# Patient Record
Sex: Female | Born: 1935 | Race: White | Hispanic: No | Smoking: Never smoker
Health system: Southern US, Community
[De-identification: ages and names within clinical notes are randomized; demographics above are authoritative.]

## PROBLEM LIST (undated history)

## (undated) DIAGNOSIS — C50919 Malignant neoplasm of unspecified site of unspecified female breast: Secondary | ICD-10-CM

## (undated) DIAGNOSIS — I10 Essential (primary) hypertension: Secondary | ICD-10-CM

## (undated) HISTORY — PX: BACK SURGERY: SHX140

## (undated) HISTORY — PX: MASTECTOMY: SHX3

## (undated) HISTORY — PX: ABDOMINAL HYSTERECTOMY: SHX81

## (undated) HISTORY — PX: BREAST SURGERY: SHX581

---

## 2003-04-16 ENCOUNTER — Other Ambulatory Visit: Payer: Self-pay

## 2003-11-30 ENCOUNTER — Other Ambulatory Visit: Payer: Self-pay

## 2004-05-30 ENCOUNTER — Ambulatory Visit: Payer: Self-pay | Admitting: Internal Medicine

## 2004-06-16 ENCOUNTER — Ambulatory Visit: Payer: Self-pay

## 2004-08-28 ENCOUNTER — Ambulatory Visit: Payer: Self-pay | Admitting: Internal Medicine

## 2005-03-01 ENCOUNTER — Ambulatory Visit: Payer: Self-pay | Admitting: Internal Medicine

## 2005-08-31 ENCOUNTER — Ambulatory Visit: Payer: Self-pay | Admitting: Internal Medicine

## 2006-04-15 ENCOUNTER — Inpatient Hospital Stay: Payer: Self-pay | Admitting: Internal Medicine

## 2006-07-09 ENCOUNTER — Ambulatory Visit: Payer: Self-pay | Admitting: Gastroenterology

## 2006-08-17 IMAGING — CT CT CHEST W/ CM
1 series · 15 of 33 positions shown, 19 images · non-contrast
Comparison: none

REASON FOR EXAM: ABNORMAL CHEST XRAY      DYSPNEA RT UPPER LOBE
COMMENTS:

[Series 3: soft tissue · axial · 0.74mm/px · z∈[-262,-12]mm · 15 of 60 slices shown, 19 images]
[im 5/60  mediastinal]
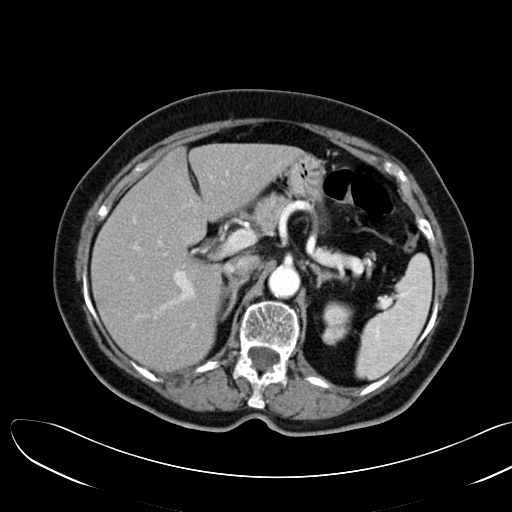
[im 5/60  lung]
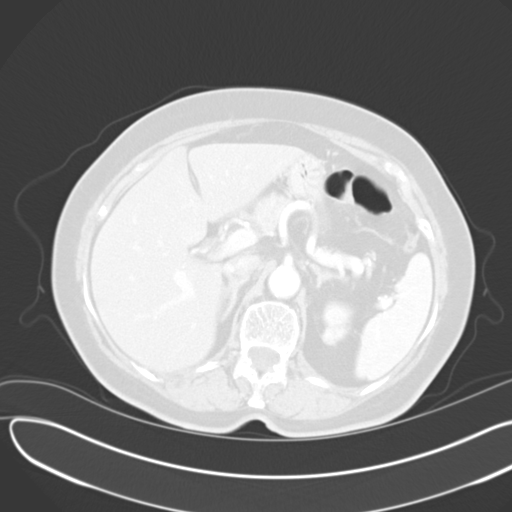
[im 9/60  lung]
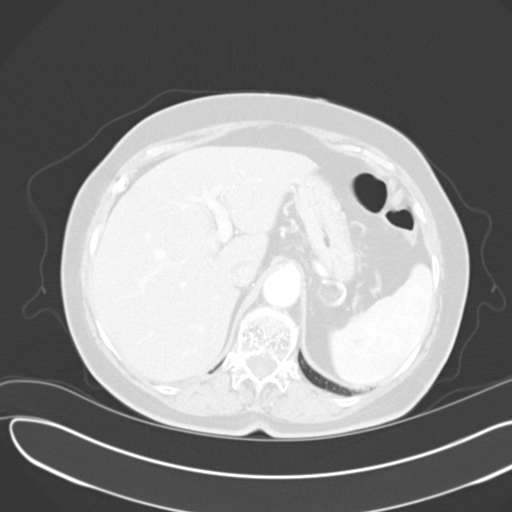
[im 12/60  lung]
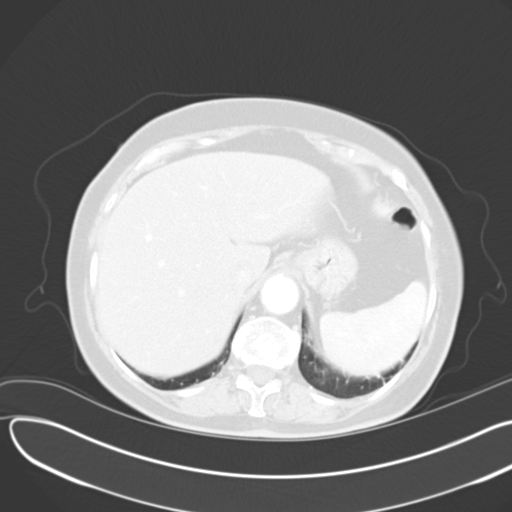
[im 16/60  lung]
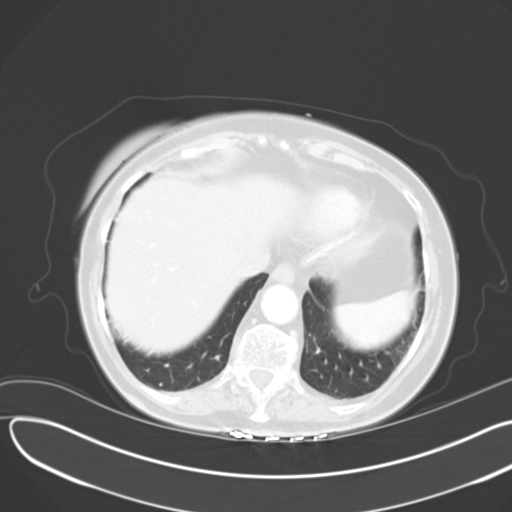
[im 20/60  mediastinal]
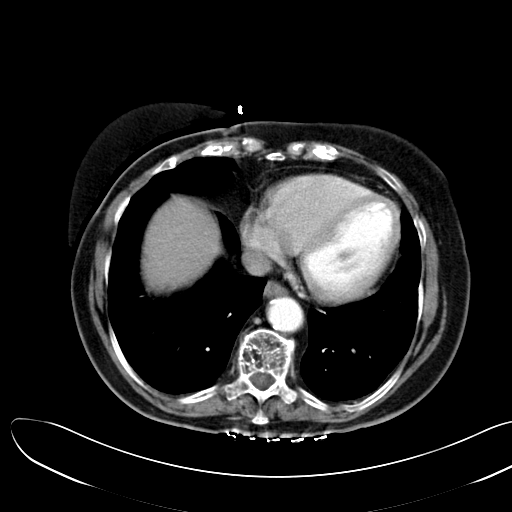
[im 20/60  lung]
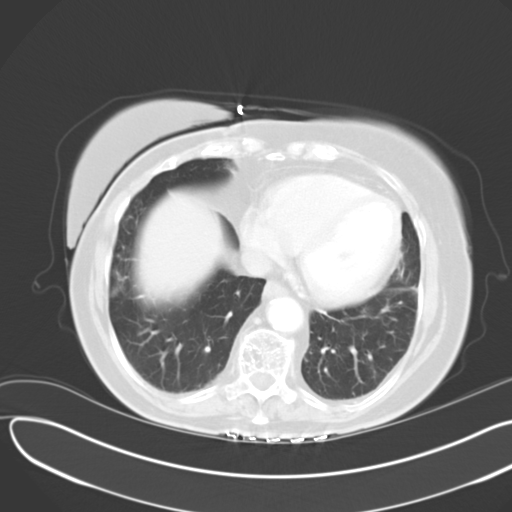
[im 24/60  lung]
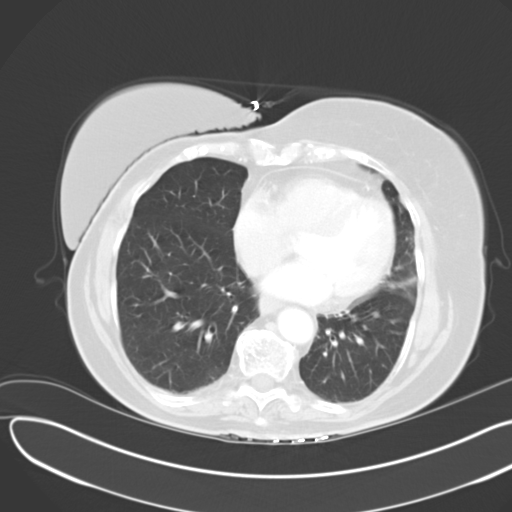
[im 27/60  lung]
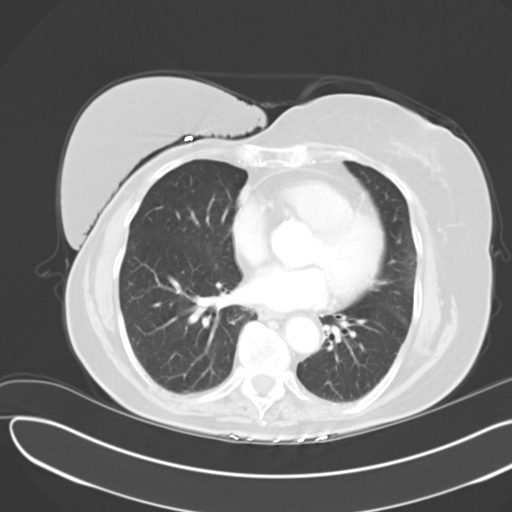
[im 31/60  lung]
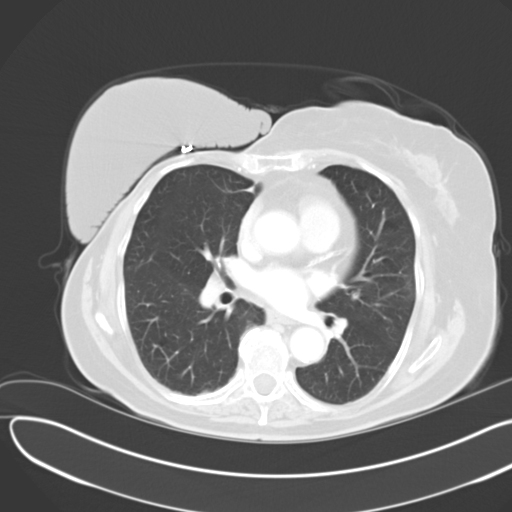
[im 33/60  mediastinal]
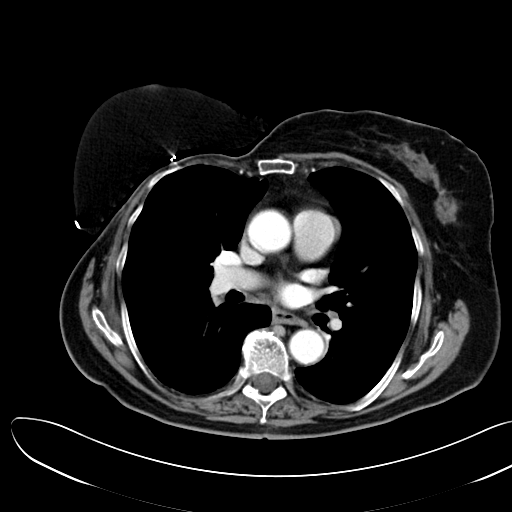
[im 33/60  lung]
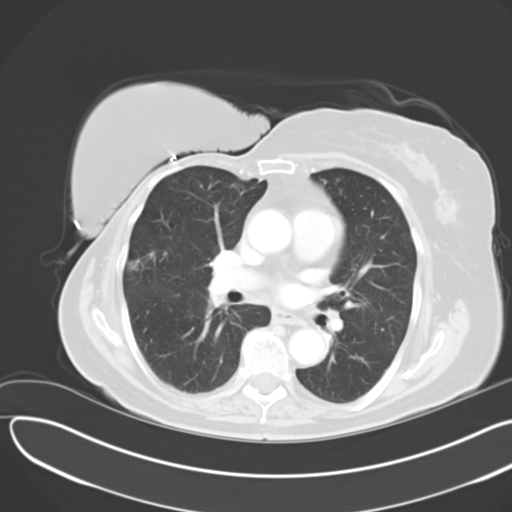
[im 36/60  lung]
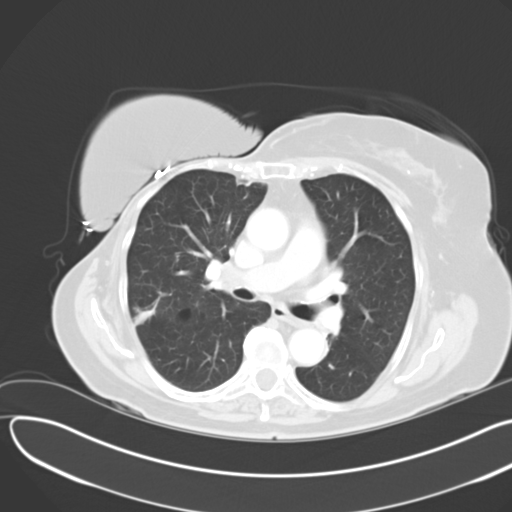
[im 40/60  lung]
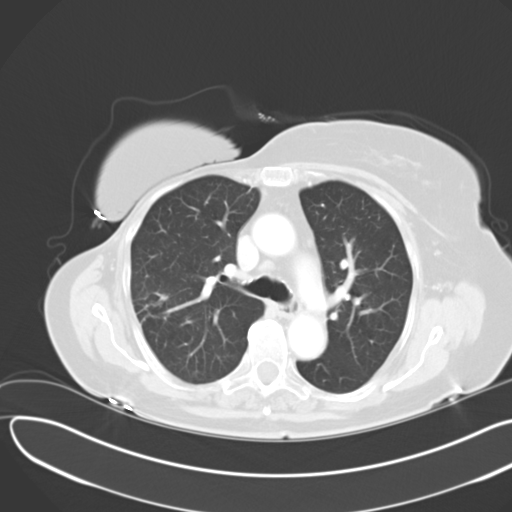
[im 44/60  lung]
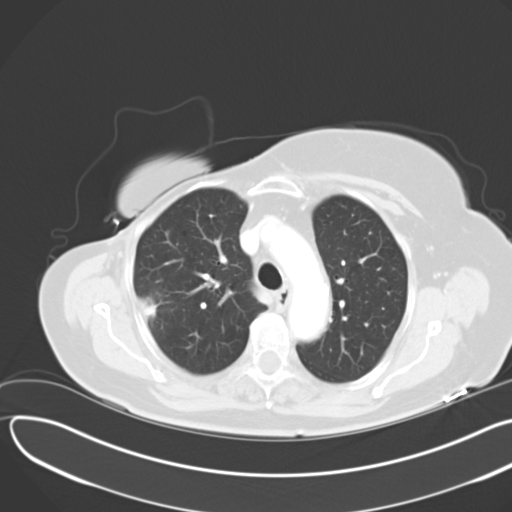
[im 48/60  mediastinal]
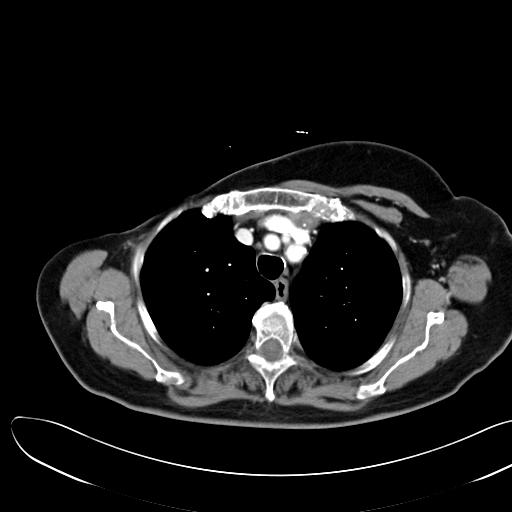
[im 48/60  lung]
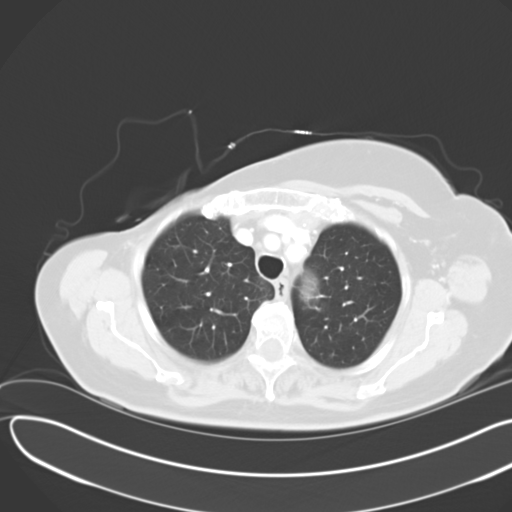
[im 51/60  lung]
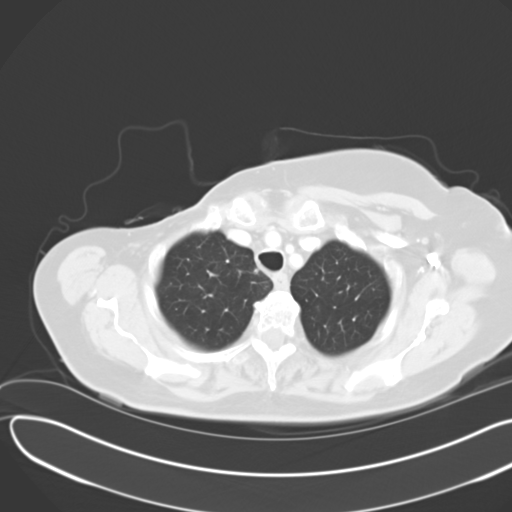
[im 55/60  lung]
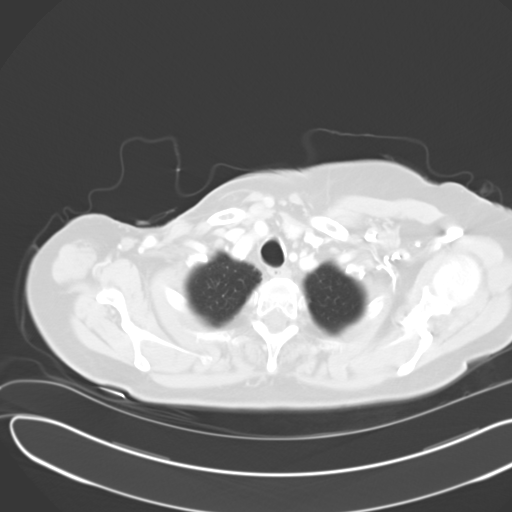

[15 of 33 positions shown; findings below may reference images not displayed]

PROCEDURE:     CT  - CT CHEST WITH CONTRAST  - May 30, 2004  [DATE]

RESULT:     5 mm helical cuts were performed through the chest with 75 ccs
of Isovue 370 contrast.  The soft tissue window shows low-density cysts
within the LEFT lobe of the thyroid gland.  The patient has a history of
RIGHT mastectomy.  No suspicious axillary or superior mediastinal adenopathy
is identified.  Some scattered subthreshold lymph nodes are noted in the AP
window but all measure less than 1 cm in short axis dimension.  No pleural
or pericardial effusions are present.  Limited cuts through the upper
abdomen do not show a suspicious solid organ abnormality. There are
extensive degenerative bony changes. Incidental note is made of a simple
cyst in the mid portion of the LEFT kidney. The lung windows show underlying
COPD with a poorly defined spiculated mass in the lateral segment of the
RIGHT upper lobe best seen on image #18, which measures 1.5-1.7 x 1.3 cm. I
do not have a prior CT available for comparison to determine the chronicity
of this lesion, but since the patient has underlying breast cancer this
should be considered a metastatic nodule until proven otherwise and a PET
scan may be helpful to determine if there is metabolic activity.  There is
no evidence of a focal pneumonia. There is some subtle interstitial scarring
in both lung bases.
IMPRESSION: 1)Lung windows show evidence of a poorly defined spiculated pleural-based
nodule measuring 1.5-1.7 x 1.3 cm.  Because the patient has a history of
breast cancer this should be considered a metastatic lesion until proven
otherwise. A PET scan may be helpful to determine if there is metabolic
activity present.

2)No evidence of a pneumonia or other nodule is seen in either lung field.

3)No suspicious axillary or mediastinal adenopathy.

4)Degenerative bony changes without lytic or blastic bony lesion.

5)Incidental note is made of a LEFT renal cyst.

## 2006-09-02 ENCOUNTER — Ambulatory Visit: Payer: Self-pay | Admitting: Internal Medicine

## 2007-04-03 ENCOUNTER — Ambulatory Visit: Payer: Self-pay | Admitting: Internal Medicine

## 2007-09-11 ENCOUNTER — Ambulatory Visit: Payer: Self-pay | Admitting: Internal Medicine

## 2007-09-16 ENCOUNTER — Ambulatory Visit: Payer: Self-pay | Admitting: Internal Medicine

## 2008-04-16 ENCOUNTER — Ambulatory Visit: Payer: Self-pay | Admitting: Orthopedic Surgery

## 2008-04-21 ENCOUNTER — Ambulatory Visit: Payer: Self-pay | Admitting: Orthopedic Surgery

## 2008-10-13 ENCOUNTER — Ambulatory Visit: Payer: Self-pay | Admitting: Internal Medicine

## 2008-10-19 ENCOUNTER — Ambulatory Visit: Payer: Self-pay | Admitting: Internal Medicine

## 2008-10-29 ENCOUNTER — Emergency Department: Payer: Self-pay | Admitting: Emergency Medicine

## 2009-10-26 ENCOUNTER — Ambulatory Visit: Payer: Self-pay | Admitting: Internal Medicine

## 2009-11-17 ENCOUNTER — Inpatient Hospital Stay (HOSPITAL_COMMUNITY): Admission: EM | Admit: 2009-11-17 | Discharge: 2009-11-25 | Payer: Self-pay | Admitting: Emergency Medicine

## 2009-11-17 ENCOUNTER — Encounter (INDEPENDENT_AMBULATORY_CARE_PROVIDER_SITE_OTHER): Payer: Self-pay | Admitting: Internal Medicine

## 2009-11-17 ENCOUNTER — Ambulatory Visit: Payer: Self-pay | Admitting: Vascular Surgery

## 2009-11-24 ENCOUNTER — Encounter (INDEPENDENT_AMBULATORY_CARE_PROVIDER_SITE_OTHER): Payer: Self-pay | Admitting: Internal Medicine

## 2010-01-09 ENCOUNTER — Emergency Department: Payer: Self-pay | Admitting: Emergency Medicine

## 2010-04-15 ENCOUNTER — Emergency Department: Payer: Self-pay | Admitting: Unknown Physician Specialty

## 2010-06-08 LAB — BASIC METABOLIC PANEL
BUN: 10 mg/dL (ref 6–23)
Chloride: 102 mEq/L (ref 96–112)
GFR calc Af Amer: >60 mL/min (ref >60)
Glucose, Bld: 102 mg/dL — ABNORMAL HIGH (ref 70–99)
Sodium: 141 mEq/L (ref 135–145)

## 2010-06-08 LAB — CBC
Hemoglobin: 10.1 g/dL — ABNORMAL LOW (ref 12.0–15.0)
Hemoglobin: 11.1 g/dL — ABNORMAL LOW (ref 12.0–15.0)
MCH: 28 pg (ref 26.0–34.0)
MCH: 28.6 pg (ref 26.0–34.0)
MCV: 88.4 fL (ref 78.0–100.0)
RBC: 3.53 MIL/uL — ABNORMAL LOW (ref 3.87–5.11)
RBC: 3.97 MIL/uL (ref 3.87–5.11)
WBC: 5.9 10*3/uL (ref 4.0–10.5)
WBC: 7.6 10*3/uL (ref 4.0–10.5)

## 2010-06-09 LAB — COMPREHENSIVE METABOLIC PANEL
ALT: 19 U/L (ref 0–35)
AST: 28 U/L (ref 0–37)
Albumin: 3.5 g/dL (ref 3.5–5.2)
Alkaline Phosphatase: 46 U/L (ref 39–117)
Alkaline Phosphatase: 59 U/L (ref 39–117)
BUN: 10 mg/dL (ref 6–23)
BUN: 20 mg/dL (ref 6–23)
CO2: 23 mEq/L (ref 19–32)
Calcium: 8.8 mg/dL (ref 8.4–10.5)
Chloride: 107 mEq/L (ref 96–112)
Creatinine, Ser: 0.62 mg/dL (ref 0.4–1.2)
Creatinine, Ser: 1.08 mg/dL (ref 0.4–1.2)
Glucose, Bld: 110 mg/dL — ABNORMAL HIGH (ref 70–99)
Glucose, Bld: 192 mg/dL — ABNORMAL HIGH (ref 70–99)
Potassium: 3.8 mEq/L (ref 3.5–5.1)
Total Bilirubin: 0.7 mg/dL (ref 0.3–1.2)
Total Protein: 6.6 g/dL (ref 6.0–8.3)

## 2010-06-09 LAB — URINALYSIS, ROUTINE W REFLEX MICROSCOPIC
Hgb urine dipstick: NEGATIVE
pH: 5 (ref 5.0–8.0)

## 2010-06-09 LAB — DIFFERENTIAL
Basophils Absolute: 0 10*3/uL (ref 0.0–0.1)
Eosinophils Absolute: 0.1 10*3/uL (ref 0.0–0.7)
Eosinophils Relative: 1 % (ref 0–5)
Lymphocytes Relative: 7 % — ABNORMAL LOW (ref 12–46)
Lymphs Abs: 0.8 10*3/uL (ref 0.7–4.0)
Neutro Abs: 9.4 10*3/uL — ABNORMAL HIGH (ref 1.7–7.7)

## 2010-06-09 LAB — CBC
HCT: 33 % — ABNORMAL LOW (ref 36.0–46.0)
HCT: 33.4 % — ABNORMAL LOW (ref 36.0–46.0)
HCT: 34.8 % — ABNORMAL LOW (ref 36.0–46.0)
HCT: 38.5 % (ref 36.0–46.0)
Hemoglobin: 10.8 g/dL — ABNORMAL LOW (ref 12.0–15.0)
Hemoglobin: 12.3 g/dL (ref 12.0–15.0)
MCH: 28 pg (ref 26.0–34.0)
MCH: 28.1 pg (ref 26.0–34.0)
MCH: 28.7 pg (ref 26.0–34.0)
MCHC: 31.9 g/dL (ref 30.0–36.0)
MCHC: 32.8 g/dL (ref 30.0–36.0)
MCV: 85.9 fL (ref 78.0–100.0)
MCV: 88 fL (ref 78.0–100.0)
MCV: 88.8 fL (ref 78.0–100.0)
RBC: 3.75 MIL/uL — ABNORMAL LOW (ref 3.87–5.11)
RBC: 3.76 MIL/uL — ABNORMAL LOW (ref 3.87–5.11)
RDW: 13.6 % (ref 11.5–15.5)
RDW: 14.5 % (ref 11.5–15.5)
WBC: 4.8 10*3/uL (ref 4.0–10.5)

## 2010-06-09 LAB — BASIC METABOLIC PANEL
BUN: 10 mg/dL (ref 6–23)
CO2: 26 mEq/L (ref 19–32)
CO2: 31 mEq/L (ref 19–32)
Calcium: 8.5 mg/dL (ref 8.4–10.5)
Chloride: 102 mEq/L (ref 96–112)
GFR calc Af Amer: >60 mL/min (ref >60)
Glucose, Bld: 111 mg/dL — ABNORMAL HIGH (ref 70–99)
Glucose, Bld: 134 mg/dL — ABNORMAL HIGH (ref 70–99)
Potassium: 3.8 mEq/L (ref 3.5–5.1)
Potassium: 3.9 mEq/L (ref 3.5–5.1)
Sodium: 139 mEq/L (ref 135–145)
Sodium: 140 mEq/L (ref 135–145)

## 2010-06-09 LAB — POCT CARDIAC MARKERS
CKMB, poc: <1 ng/mL — ABNORMAL LOW (ref 1.0–8.0)
Troponin i, poc: <0.05 ng/mL (ref 0.00–0.09)

## 2010-06-09 LAB — CK TOTAL AND CKMB (NOT AT ARMC): CK, MB: 1.9 ng/mL (ref 0.3–4.0)

## 2010-06-09 LAB — CARDIAC PANEL(CRET KIN+CKTOT+MB+TROPI)
CK, MB: 1.7 ng/mL (ref 0.3–4.0)
CK, MB: 2 ng/mL (ref 0.3–4.0)
Total CK: 52 U/L (ref 7–177)
Troponin I: 0.01 ng/mL (ref 0.00–0.06)
Troponin I: 0.01 ng/mL (ref 0.00–0.06)

## 2010-06-09 LAB — PROTIME-INR
INR: 0.91 (ref 0.00–1.49)
Prothrombin Time: 12.5 seconds (ref 11.6–15.2)

## 2010-06-09 LAB — URINE CULTURE
Colony Count: NO GROWTH
Culture  Setup Time: 201108250855
Culture: NO GROWTH

## 2010-06-09 LAB — TSH: TSH: 1.417 u[IU]/mL (ref 0.350–4.500)

## 2010-10-31 ENCOUNTER — Ambulatory Visit: Payer: Self-pay | Admitting: Internal Medicine

## 2011-01-30 ENCOUNTER — Ambulatory Visit: Payer: Self-pay | Admitting: Internal Medicine

## 2011-11-01 ENCOUNTER — Ambulatory Visit: Payer: Self-pay | Admitting: Internal Medicine

## 2012-10-30 ENCOUNTER — Observation Stay: Payer: Self-pay | Admitting: Internal Medicine

## 2012-10-30 LAB — URINALYSIS, COMPLETE
Bacteria: NONE SEEN
Bilirubin,UR: NEGATIVE
Specific Gravity: 1.013 (ref 1.003–1.030)
WBC UR: 8 /HPF (ref 0–5)

## 2012-10-30 LAB — COMPREHENSIVE METABOLIC PANEL
Albumin: 3.5 g/dL (ref 3.4–5.0)
BUN: 15 mg/dL (ref 7–18)
Bilirubin,Total: 0.9 mg/dL (ref 0.2–1.0)
Chloride: 104 mmol/L (ref 98–107)
Co2: 27 mmol/L (ref 21–32)
Creatinine: 0.86 mg/dL (ref 0.60–1.30)
EGFR (African American): >60
Osmolality: 275 (ref 275–301)
SGOT(AST): 18 U/L (ref 15–37)
SGPT (ALT): 16 U/L (ref 12–78)
Sodium: 137 mmol/L (ref 136–145)

## 2012-10-30 LAB — CBC: RDW: 13.9 % (ref 11.5–14.5)

## 2012-10-30 LAB — SEDIMENTATION RATE: Erythrocyte Sed Rate: 56 mm/hr — ABNORMAL HIGH (ref 0–30)

## 2012-10-31 LAB — BASIC METABOLIC PANEL
Anion Gap: 7 (ref 7–16)
EGFR (Non-African Amer.): 58 — ABNORMAL LOW
Potassium: 4.2 mmol/L (ref 3.5–5.1)
Sodium: 138 mmol/L (ref 136–145)

## 2012-10-31 LAB — CBC WITH DIFFERENTIAL/PLATELET
Basophil #: 0 10*3/uL (ref 0.0–0.1)
Basophil %: 0.2 %
Eosinophil #: 0 10*3/uL (ref 0.0–0.7)
HGB: 12.3 g/dL (ref 12.0–16.0)
Lymphocyte %: 9.5 %
MCV: 85 fL (ref 80–100)
Monocyte %: 5.5 %
Neutrophil %: 84.6 %
RBC: 4.24 10*6/uL (ref 3.80–5.20)
RDW: 13.9 % (ref 11.5–14.5)
WBC: 5 10*3/uL (ref 3.6–11.0)

## 2012-11-01 LAB — CBC WITH DIFFERENTIAL/PLATELET
Basophil #: 0 10*3/uL (ref 0.0–0.1)
Eosinophil #: 0.2 10*3/uL (ref 0.0–0.7)
HCT: 35.3 % (ref 35.0–47.0)
HGB: 12.1 g/dL (ref 12.0–16.0)
MCH: 29.1 pg (ref 26.0–34.0)
MCV: 85 fL (ref 80–100)
Platelet: 205 10*3/uL (ref 150–440)
RBC: 4.15 10*6/uL (ref 3.80–5.20)

## 2012-11-01 LAB — BASIC METABOLIC PANEL
Creatinine: 0.93 mg/dL (ref 0.60–1.30)
Glucose: 90 mg/dL (ref 65–99)
Osmolality: 281 (ref 275–301)
Sodium: 139 mmol/L (ref 136–145)

## 2014-05-12 ENCOUNTER — Ambulatory Visit: Payer: Self-pay | Admitting: Physician Assistant

## 2014-07-16 NOTE — H&P (Signed)
PATIENT NAME:  Shelia Stewart, Shelia Stewart MR#:  [id number] DATE OF BIRTH:  1925-05-31  DATE OF ADMISSION:  10/30/2012  PRIMARY CARE PROVIDER: Dr. Ginette Pitman.   EMERGENCY DEPARTMENT REFERRING PHYSICIAN: Dr. Harvest Dark.   CHIEF COMPLAINT: Bilateral hip pain, difficulty with ambulation.   HISTORY OF PRESENT ILLNESS: The patient is an 81 year old white female with history of hypertension and chronic back pain, previous history of left hip fracture, hypercholesteremia, who reports that for the past 2 weeks she has had progressive pain involving both of her hips. She went to see Dr. Ginette Pitman last Wednesday for left hip pain that was worse. He did x-rays of her hip, and they were negative. She was referred to ortho. She has an appointment coming up. However, over the last few days, she has not been able to walk. Every time she puts weight on that it hurts even more, and so she came to the ED. In the ER, the patient had pelvic x-rays and hip x-rays, and they were negative for any acute fracture. Due to her living alone and having difficulty with ambulation, we are asked to admit her for observation. She, otherwise, denies any pain in any of her other joints.   PAST MEDICAL HISTORY:   1.  Hypertension.  2.  History of chronic back pain.  3.  History of left hip fracture status post repair.  4.  Hyperlipidemia.   PAST SURGICAL HISTORY:  1.  Status post C-section x 2.  2.  Status post hysterectomy.  3.  Status post right-sided mastectomy for breast cancer.  4.  Left hip repair 10 years ago.  5.  Status post hemorrhoidectomy.  6.  Status post appendectomy. 7.  Status post C-section  x 2.    ALLERGIES: FOSAMAX, CODEINE AND MOTRIN.   CURRENT MEDICATIONS: Aspirin 81 mg 1 tab p.o. daily, hydrochlorothiazide 12.5 p.o. daily, meloxicam 7.5 mg 1 tab p.o. b.i.d., pravastatin 40 at bedtime, tramadol 50 mg 1 to 2 tabs 2 times a day as needed.   SOCIAL HISTORY: Does not smoke. Does not drink. No drugs. Lives alone. Has a  daughter that checks up on her.   REVIEW OF SYSTEMS:  CONSTITUTIONAL: Denies any fevers. Complains of fatigue, weakness, hip pain. No weight loss. No weight gain.  EYES: No blurred or double vision. No pain. No redness. No inflammation. No glaucoma. No cataracts.  EARS, NOSE, THROAT: No tinnitus. Hard of hearing. Wears hearing aid. No seasonal or year-round allergies. No epistaxis noted. No nasal discharge. No difficulty swallowing.  RESPIRATORY: Denies any cough, wheezing, hemoptysis. No COPD.  CARDIOVASCULAR: Denies any chest pain, orthopnea or edema.  GASTROINTESTINAL: No nausea, vomiting, diarrhea. No abdominal pain. No hematemesis. No melena.  GENITOURINARY: Denies any dysuria, hematuria, renal calc or frequency.  ENDOCRINE: Denies any polyuria, nocturia or thyroid problems  HEMATOLOGIC AND LYMPHATIC: Denies any major bruisability or bleeding.  SKIN: No acne. No rash. No changes in mole, hair or skin.  MUSCULOSKELETAL: Complains of pain in both hips.  NEUROLOGIC:  No numbness. No CVA. No TIA.  PSYCHIATRIC: No anxiety. No insomnia. No ADD.   PHYSICAL EXAMINATION: VITAL SIGNS: Temperature 98.3, pulse 67, respirations 20, blood pressure 130/60. O2 of 97%.  GENERAL: The patient is an elderly, white female, appearing frail.  HEENT: Head atraumatic, normocephalic. Pupils equally round, reactive to light and accommodation. There is no conjunctival pallor. No scleral icterus. Nasal exam shows no drainage or ulceration.  Oropharynx is clear without any exudate.  NECK: Supple without any JVD.  CARDIOVASCULAR: Regular rate and rhythm. No murmurs, rubs, clicks or gallops. PMI is not displaced.  LUNGS: Clear to auscultation bilaterally without any rales, rhonchi, wheezing.  ABDOMEN: Soft, nontender, nondistended. Positive bowel sounds x 4. No hepatosplenomegaly.  EXTREMITIES: No clubbing, cyanosis or edema.  SKIN: No rash.  LYMPHATICS: No lymph nodes palpable.  VASCULAR: Good DP, PT pulses.   PSYCHIATRIC: Not anxious or depressed.  NEUROLOGIC: Awake, alert, oriented x 3. No focal deficits.  MUSCULOSKELETAL: She has limited range of motion in both hip with reproducible pain with flexion and extension of the hip.   EVALUATION IN THE EMERGENCY DEPARTMENT:  Right hip complete shows mild degenerative changes of the right hip. No evidence of acute fracture.  LEFT HIP: There are degenerative changes. The left hip shows previous ORIF without any acute fracture. PELVIC: Diffuse osteopenia, narrowing of the hip joint bilaterally, consistent with osteoarthritis. CHEST X-RAY: Mild hyperinflation consistent with COPD.   ASSESSMENT AND PLAN: The patient is an 81 year old white female with history of hypertension, hyperlipidemia, history of left hip fracture, presents with severe progressive bilateral hip pain, difficulty with walking.  1.  Difficulty with ambulation with severe pain with rest and ambulation. At this time, she lives alone and is unsafe for discharge to home. The patient will be placed under observation. I am going to start her on p.o. prednisone. We will continue meloxicam. I will also place her on low-dose Percocet as needed, and I will ask ortho to see the patient. We will check a sed rate as well to make sure she does not have any evidence of polymyalgia rheumatica.  2.  Hypertension. We will continue HCTZ, follow her blood pressure.  3.  Hyperlipidemia. Continue pravastatin.  4.  Miscellaneous:  I will place her on Lovenox for DVT prophylaxis. We will also get PT evaluation for this patient.    NOTE: 45 minutes spent on this patient.      ____________________________ Lafonda Mosses. Posey Pronto, MD shp:dmm D: 10/30/2012 12:57:00 ET T: 10/30/2012 13:09:07 ET JOB#: [id number]  cc: Kevan Prouty H. Posey Pronto, MD, <Dictator> Alric Seton MD ELECTRONICALLY SIGNED 11/03/2012 10:29

## 2014-07-16 NOTE — Consult Note (Signed)
Brief Consult Note: Diagnosis: Bilateral hip pain.   Comments: Complains of bilateral hip pain, left > right.  Lives independently at home and uses a walker for ambulation.  Hip fracture on left 13 years ago.  Has been told that she has both DJD and osteoporosis.  Was offered THA in past but states that she does not wish to undergo any surgical intervention.  PE: Bilateral hip with good range of motion.  Decrease b/l in internal and external rotation of the hips. Good preservation of strength b/l with flexion, extension, abduction and adduction, IR and ER.  Some discomfort left hip with resisted flexion and rotation. B/l TTP in groin.   XR: no fracture noted. Short IM nail with healed fracture left hip.  DJD bilateral hips. Osteopenia bilateral.   Assessment: bilateral hip pain with DJD.    Plan: May continue to be WBAT.  Recommend pain control and PT for ambulation.  If unable to control pain, may need to discuss with radiology possibility of proceeding with image guided hip corticosteroid injections. If still no improvement, may be helpful to have MRI of lumbar spine to look for other cause of hip pain.  May be done as an outpatient.  Electronic Signatures: Dawayne Patricia (MD)  (Signed 07-Aug-14 17:11)  Authored: Brief Consult Note   Last Updated: 07-Aug-14 17:11 by Dawayne Patricia (MD)

## 2014-07-16 NOTE — Discharge Summary (Signed)
PATIENT NAME:  Shelia Stewart, Shelia Stewart MR#:  [id number] DATE OF BIRTH:  [birth year]  DATE OF ADMISSION:  10/30/2012 DATE OF DISCHARGE:  11/01/2012  REASON FOR ADMISSION: Bilateral hip pain with difficulty with ambulation.   HISTORY OF PRESENT ILLNESS: The patient is an 81 year old female with a history of hypertension and chronic back pain with previous left hip fracture who presented to the Emergency Room with difficulty walking and severe pain. In the Emergency Room, her hip films were unremarkable. She was seen by Ortho who felt that the pain management and physical therapy were required. She was admitted for further evaluation.   PAST MEDICAL HISTORY: 1.  Benign hypertension.  2.  Hyperlipidemia.  3.  Chronic back pain.  4.  Status post left hip fracture with surgical repair.  5.  Status post appendectomy.  6.  Breast cancer, status post right mastectomy.   MEDICATIONS ON ADMISSION: Please see admission note.   ALLERGIES: FOSAMAX, CODEINE, AND MOTRIN.  SOCIAL HISTORY, FAMILY HISTORY, AND  REVIEW OF SYSTEMS:  As per admission note.   PHYSICAL EXAM:  GENERAL:  The patient was in no acute distress.  VITAL SIGNS: Were stable and she was afebrile.  HEENT: Unremarkable.  NECK: Supple without JVD.  LUNGS Clear.  CARDIAC: Regular rate and rhythm with normal S1, S2.  ABDOMEN: Soft and nontender.  EXTREMITIES: Without edema.  NEUROLOGIC EXAM: Grossly nonfocal.   LABORATORY DATA: Hip films revealed degenerative changes with osteopenia and osteoarthritis.   HOSPITAL COURSE: The patient was admitted with ambulatory dysfunction and severe pain of her hips, right greater than left. She was seen by Ortho who recommended conservative therapy. She was subsequently seen by physical therapy. She was placed on Percocet for pain management with improvement of her symptoms. She was ambulating with physical therapy using a walker and did well. Placement was not recommended by physical therapy. The family agreed  to take her home with care 24 hours a day 7 days a week. Case manager was involved and help with discharge arrangements. By 11/01/2012, the patient was stable and ready for discharge.   DISCHARGE DIAGNOSES: 1.  Osteoarthritis.  2.  Hip pain.  3.  Osteopenia.  4.  Ambulatory dysfunction.  5.  Benign hypertension.  6.  Hyperlipidemia.   DISCHARGE MEDICATIONS: 1.  Mobic 7.5 mg p.o. daily.  2.  Hydrochlorothiazide 12.5 mg p.o. daily.  3.  Pravastatin 40 mg p.o. at bedtime.  4.  Percocet 5/325 mg 1 p.o. q. 8 hours p.r.n. pain.  5.  Prednisone taper as directed.  6.  Protonix 40 mg p.o. daily.  7.  Ambien 5 mg p.o. at bedtime p.r.n. sleep.  8.  Aspirin 81 mg p.o. daily.  9.  Zofran 4 mg p.o. q. 4 hours p.r.n. nausea and vomiting.   FOLLOW-UP PLANS AND APPOINTMENTS: The patient was discharged home on a low sodium diet. She will be followed by home health. She will be in the care of her daughter. She will follow up with Dr. Ginette Pitman in 1 to 2 weeks, sooner if needed.    ____________________________ Leonie Douglas Doy Hutching, MD jds:dp D: 11/01/2012 08:30:43 ET T: 11/01/2012 09:33:50 ET JOB#: [id number]  cc: Leonie Douglas. Doy Hutching, MD, <Dictator> [place] Gustafson Lennice Sites MD ELECTRONICALLY SIGNED 11/01/2012 12:52

## 2015-10-08 ENCOUNTER — Encounter: Payer: Self-pay | Admitting: Emergency Medicine

## 2015-10-08 ENCOUNTER — Emergency Department: Payer: Medicare Other

## 2015-10-08 ENCOUNTER — Emergency Department
Admission: EM | Admit: 2015-10-08 | Discharge: 2015-10-08 | Disposition: A | Discharge status: Home / Self Care | Payer: Medicare Other | Attending: Emergency Medicine | Admitting: Emergency Medicine

## 2015-10-08 DIAGNOSIS — Z853 Personal history of malignant neoplasm of breast: Secondary | ICD-10-CM | POA: Insufficient documentation

## 2015-10-08 DIAGNOSIS — R0789 Other chest pain: Secondary | ICD-10-CM | POA: Diagnosis present

## 2015-10-08 LAB — BASIC METABOLIC PANEL
Anion gap: 6 (ref 5–15)
BUN: 22 mg/dL — AB (ref 6–20)
CHLORIDE: 107 mmol/L (ref 101–111)
CO2: 26 mmol/L (ref 22–32)
CREATININE: 0.77 mg/dL (ref 0.44–1.00)
Calcium: 9.2 mg/dL (ref 8.9–10.3)
GFR calc Af Amer: >60 mL/min (ref >60)
GFR calc non Af Amer: >60 mL/min (ref >60)
Glucose, Bld: 105 mg/dL — ABNORMAL HIGH (ref 65–99)
Potassium: 3.9 mmol/L (ref 3.5–5.1)
SODIUM: 139 mmol/L (ref 135–145)

## 2015-10-08 LAB — CBC
HCT: 38.6 % (ref 35.0–47.0)
Hemoglobin: 13.1 g/dL (ref 12.0–16.0)
MCH: 29.1 pg (ref 26.0–34.0)
MCHC: 34.1 g/dL (ref 32.0–36.0)
MCV: 85.3 fL (ref 80.0–100.0)
PLATELETS: 194 10*3/uL (ref 150–440)
RBC: 4.52 MIL/uL (ref 3.80–5.20)
RDW: 14.1 % (ref 11.5–14.5)
WBC: 6 10*3/uL (ref 3.6–11.0)

## 2015-10-08 LAB — TROPONIN I: Troponin I: <0.03 ng/mL (ref <0.03)

## 2015-10-08 MED ORDER — FENTANYL CITRATE (PF) 100 MCG/2ML IJ SOLN
25.0000 ug | Freq: Once | INTRAMUSCULAR | Status: AC
  Administered 2015-10-08: 25 ug via INTRAVENOUS
  Filled 2015-10-08: qty 2

## 2015-10-08 MED ORDER — IOPAMIDOL (ISOVUE-370) INJECTION 76%
75.0000 mL | Freq: Once | INTRAVENOUS | Status: AC | PRN
  Administered 2015-10-08: 75 mL via INTRAVENOUS

## 2015-10-08 MED ORDER — SENNA 8.6 MG PO TABS: 1.0000 | ORAL_TABLET | Freq: Every day | ORAL | Status: DC

## 2015-10-08 MED ORDER — ACETAMINOPHEN 500 MG PO TABS
1000.0000 mg | ORAL_TABLET | Freq: Once | ORAL | Status: AC
  Administered 2015-10-08: 1000 mg via ORAL
  Filled 2015-10-08: qty 2

## 2015-10-08 MED ORDER — OXYCODONE HCL 5 MG PO TABS: 5.0000 mg | ORAL_TABLET | ORAL | Status: AC | PRN

## 2015-10-08 MED ORDER — LIDOCAINE 5 % EX PTCH: 1.0000 | MEDICATED_PATCH | Freq: Two times a day (BID) | CUTANEOUS | Status: AC

## 2015-10-08 MED ORDER — OXYCODONE HCL 5 MG PO TABS
5.0000 mg | ORAL_TABLET | Freq: Once | ORAL | Status: AC
  Administered 2015-10-08: 5 mg via ORAL
  Filled 2015-10-08: qty 1

## 2015-10-08 MED ORDER — LIDOCAINE 5 % EX PTCH
1.0000 | MEDICATED_PATCH | CUTANEOUS | Status: DC
  Administered 2015-10-08: 1 via TRANSDERMAL
  Filled 2015-10-08: qty 1

## 2015-10-08 NOTE — ED Notes (Addendum)
Pt to ed with c/o left arm pain and left chest pain under rib and breast area that started about 2 days ago.  States she awoke at 1 am last night with severe sharp pain.  Pt reports pain is worse with talking or deep breathing or palpation.  Pt appears to be in moderate to severe distress.  EKG done on arrival

## 2015-10-08 NOTE — Discharge Instructions (Signed)
Take 1000 mg of Tylenol every 8 hours. Take 5 mg of oxycodone every 4 hours for the next 24 hours and then as needed. Take Senokot one tablet a day while on oxycodone to prevent constipation. Apply 1 Lidoderm patch every 24 hours. Use incentive spirometer once every hour when awake. Follow-up with her doctor Monday. Return for worsening pain, cough, fever, chills, or any new symptoms concerning to you.

## 2015-10-08 NOTE — ED Provider Notes (Signed)
[facility] Emergency Department Provider Note  ____________________________________________  Time seen: Approximately 10:03 AM  I have reviewed the triage vital signs and the nursing notes.   HISTORY  Chief Complaint Arm Pain   HPI Shelia Stewart is a 82 y.o. female with history of remote breast cancer status post mastectomy on the right side and osteoporosis who presents for evaluation of left chest wall pain. Patient reports that the pain has been going on for 2 days. Started when she woke up 2 days ago. It is worse with movement or palpation of the right chest wall, worse with breathing or coughing. Patient reports that she woke up at 1 AM with severe worsening of her pain. She denies shortness of breath but is splinting due to the severity of the pain. She took 1 tramadol with no resolution of her pain. She denies fever, productive cough, history of coronary artery disease, nausea, vomiting, diaphoresis, abdominal pain. Patient is followed yearly for her breast cancer with her last mammogram a year and a half ago.  History reviewed. No pertinent past medical history.  There are no active problems to display for this patient.   History reviewed. No pertinent past surgical history.  Current Outpatient Rx  Name  Route  Sig  Dispense  Refill  . lidocaine (LIDODERM) 5 %   Transdermal   Place 1 patch onto the skin every 12 (twelve) hours. Remove & Discard patch within 12 hours or as directed by MD   10 patch   0   . oxyCODONE (ROXICODONE) 5 MG immediate release tablet   Oral   Take 1 tablet (5 mg total) by mouth every 4 (four) hours as needed for moderate pain or severe pain.   20 tablet   0   . senna (SENOKOT) 8.6 MG TABS tablet   Oral   Take 1 tablet (8.6 mg total) by mouth daily.   30 each   0     Allergies Codeine  History reviewed. No pertinent family history.  Social History Social History  Substance Use Topics  . Smoking  status: Never Smoker   . Smokeless tobacco: None  . Alcohol Use: No    Review of Systems  Constitutional: Negative for fever. Eyes: Negative for visual changes. ENT: Negative for sore throat. Cardiovascular: + left sided chest pain. Respiratory: Negative for shortness of breath. Gastrointestinal: Negative for abdominal pain, vomiting or diarrhea. Genitourinary: Negative for dysuria. Musculoskeletal: Negative for back pain. Skin: Negative for rash. Neurological: Negative for headaches, weakness or numbness.  ____________________________________________   PHYSICAL EXAM:  VITAL SIGNS: ED Triage Vitals  Enc Vitals Group     BP 10/08/15 0906 166/63 mmHg     Pulse Rate 10/08/15 0906 88     Resp 10/08/15 0906 18     Temp 10/08/15 0906 97.5 F (36.4 C)     Temp Source 10/08/15 0906 Oral     SpO2 10/08/15 0906 96 %     Weight 10/08/15 0906 138 lb (62.596 kg)     Height 10/08/15 0906 5\' 4"  (1.626 m)     Head Cir --      Peak Flow --      Pain Score 10/08/15 0907 8     Pain Loc --      Pain Edu? --      Excl. in [place]? --     Constitutional: Alert and oriented, splinting and in mild distress.  HEENT:      Head:  Normocephalic and atraumatic.         Eyes: Conjunctivae are normal. Sclera is non-icteric. EOMI. PERRL      Mouth/Throat: Mucous membranes are moist.       Neck: Supple with no signs of meningismus. Cardiovascular: Regular rate and rhythm. No murmurs, gallops, or rubs. 2+ symmetrical distal pulses are present in all extremities. No JVD. Respiratory: Normal respiratory effort. Lungs are clear to auscultation bilaterally. No wheezes, crackles, or rhonchi. Tenderness to palpation over the left chest wall and also tenderness with abduction of the left arm Gastrointestinal: Soft, non tender, and non distended with positive bowel sounds. No rebound or guarding. Genitourinary: No CVA tenderness. Musculoskeletal: Nontender with normal range of motion in all extremities. No  edema, cyanosis, or erythema of extremities. Neurologic: Normal speech and language. Face is symmetric. Moving all extremities. No gross focal neurologic deficits are appreciated. Skin: Skin is warm, dry and intact. No rash noted. Psychiatric: Mood and affect are normal. Speech and behavior are normal.  ____________________________________________   LABS (all labs ordered are listed, but only abnormal results are displayed)  Labs Reviewed  BASIC METABOLIC PANEL - Abnormal; Notable for the following:    Glucose, Bld 105 (*)    BUN 22 (*)    All other components within normal limits  CBC  TROPONIN I   ____________________________________________  EKG  ED ECG REPORT I, Rudene Re, the attending physician, personally viewed and interpreted this ECG.  Normal sinus rhythm, rate of 82, normal intervals, normal axis, no ST elevations or depressions. ____________________________________________  RADIOLOGY  CXR: Negative ____________________________________________   PROCEDURES  Procedure(s) performed: None Critical Care performed:  Yes  CRITICAL CARE Performed by: Rudene Re  ?  Total critical care time: 35 min  Critical care time was exclusive of separately billable procedures and treating other patients.  Critical care was necessary to treat or prevent imminent or life-threatening deterioration.  Critical care was time spent personally by me on the following activities: development of treatment plan with patient and/or surrogate as well as nursing, discussions with consultants, evaluation of patient's response to treatment, examination of patient, obtaining history from patient or surrogate, ordering and performing treatments and interventions, ordering and review of laboratory studies, ordering and review of radiographic studies, pulse oximetry and re-evaluation of patient's condition.  ____________________________________________   INITIAL IMPRESSION /  ASSESSMENT AND PLAN / ED COURSE  82 y.o. female with history of remote breast cancer status post mastectomy on the right side and osteoporosis who presents for evaluation of left chest wall pain x 2 days. Patient in mild to moderate distress due to pain, splinting, vital signs are within normal limits, lungs are clear to auscultation, no pitting edema, chest wall is tender to palpation. Differential diagnosis including musculoskeletal pain, PE in the setting of history of cancer, recurrence of her cancer, pneumonia. We'll give IV sentinel for the pain, will check labs, troponin, and a CT of the chest to rule out pulmonary embolus. EKG with no evidence of ischemia.   _________________________ 12:27 PM on 10/08/2015 -----------------------------------------  CT with no evidence of pneumonia, recurrence of the cancer, pulmonary embolism, or any other acute findings. Labs within normal limits. Patient still complaining of 8/10 pain worse with movement. We'll give 1000 mg of Tylenol, 5 mg of oxycodone, and Lidoderm patch and reevaluate in 40 minutes. If patient is able to use incentive spirometer with no difficulty we'll send her home however if she continues to splint like she is right now we'll  admit for pain control to prevent pneumonia from splinting.  1:36 PM  Patient reports markedly improvement of her pain. She is able to use incentive spirometer. No longer splinting. She remains hemodynamically stable. We'll discharge her home on Tylenol standing, oxycodone when necessary, Lidoderm patch, incentive spirometer, and follow-up primary care doctor Monday. Discussed return precautions for any signs of pneumonia with patient and her daughter.  Pertinent labs & imaging results that were available during my care of the patient were reviewed by me and considered in my medical decision making (see chart for details).   ____________________________________________   FINAL CLINICAL IMPRESSION(S) / ED  DIAGNOSES  Final diagnoses:  Left-sided chest wall pain      NEW MEDICATIONS STARTED DURING THIS VISIT:  New Prescriptions   LIDOCAINE (LIDODERM) 5 %    Place 1 patch onto the skin every 12 (twelve) hours. Remove & Discard patch within 12 hours or as directed by MD   OXYCODONE (ROXICODONE) 5 MG IMMEDIATE RELEASE TABLET    Take 1 tablet (5 mg total) by mouth every 4 (four) hours as needed for moderate pain or severe pain.   SENNA (SENOKOT) 8.6 MG TABS TABLET    Take 1 tablet (8.6 mg total) by mouth daily.     Note:  This document was prepared using Dragon voice recognition software and may include unintentional dictation errors.    Rudene Re, MD 10/08/15 1339

## 2017-02-04 ENCOUNTER — Other Ambulatory Visit: Payer: Self-pay | Admitting: Internal Medicine

## 2017-02-04 DIAGNOSIS — I729 Aneurysm of unspecified site: Secondary | ICD-10-CM

## 2017-02-06 ENCOUNTER — Other Ambulatory Visit: Payer: Self-pay | Admitting: Orthopedic Surgery

## 2017-02-06 DIAGNOSIS — S32030A Wedge compression fracture of third lumbar vertebra, initial encounter for closed fracture: Secondary | ICD-10-CM

## 2017-02-07 ENCOUNTER — Ambulatory Visit
Admission: RE | Admit: 2017-02-07 | Discharge: 2017-02-07 | Disposition: A | Discharge status: Home / Self Care | Payer: Medicare Other | Source: Ambulatory Visit | Attending: Orthopedic Surgery | Admitting: Orthopedic Surgery

## 2017-02-07 DIAGNOSIS — X58XXXA Exposure to other specified factors, initial encounter: Secondary | ICD-10-CM | POA: Insufficient documentation

## 2017-02-07 DIAGNOSIS — S32030A Wedge compression fracture of third lumbar vertebra, initial encounter for closed fracture: Secondary | ICD-10-CM | POA: Diagnosis present

## 2017-02-07 MED ORDER — TECHNETIUM TC 99M MEDRONATE IV KIT
25.0000 | PACK | Freq: Once | INTRAVENOUS | Status: AC | PRN
  Administered 2017-02-07: 24.12 via INTRAVENOUS

## 2017-02-08 ENCOUNTER — Ambulatory Visit: Payer: Medicare Other | Admitting: Anesthesiology

## 2017-02-08 ENCOUNTER — Encounter: Payer: Self-pay | Admitting: *Deleted

## 2017-02-08 ENCOUNTER — Encounter
Admission: RE | Disposition: A | Discharge status: Home / Self Care | Payer: Self-pay | Source: Ambulatory Visit | Attending: Orthopedic Surgery

## 2017-02-08 ENCOUNTER — Ambulatory Visit: Payer: Medicare Other

## 2017-02-08 ENCOUNTER — Ambulatory Visit
Admission: RE | Admit: 2017-02-08 | Discharge: 2017-02-08 | Disposition: A | Discharge status: Home / Self Care | Payer: Medicare Other | Source: Ambulatory Visit | Attending: Orthopedic Surgery | Admitting: Orthopedic Surgery

## 2017-02-08 DIAGNOSIS — K219 Gastro-esophageal reflux disease without esophagitis: Secondary | ICD-10-CM | POA: Diagnosis not present

## 2017-02-08 DIAGNOSIS — I10 Essential (primary) hypertension: Secondary | ICD-10-CM | POA: Diagnosis not present

## 2017-02-08 DIAGNOSIS — Z853 Personal history of malignant neoplasm of breast: Secondary | ICD-10-CM | POA: Diagnosis not present

## 2017-02-08 DIAGNOSIS — Z419 Encounter for procedure for purposes other than remedying health state, unspecified: Secondary | ICD-10-CM

## 2017-02-08 DIAGNOSIS — E785 Hyperlipidemia, unspecified: Secondary | ICD-10-CM | POA: Diagnosis not present

## 2017-02-08 DIAGNOSIS — M810 Age-related osteoporosis without current pathological fracture: Secondary | ICD-10-CM | POA: Diagnosis not present

## 2017-02-08 DIAGNOSIS — S32040A Wedge compression fracture of fourth lumbar vertebra, initial encounter for closed fracture: Secondary | ICD-10-CM | POA: Insufficient documentation

## 2017-02-08 DIAGNOSIS — Z791 Long term (current) use of non-steroidal anti-inflammatories (NSAID): Secondary | ICD-10-CM | POA: Diagnosis not present

## 2017-02-08 DIAGNOSIS — Z85828 Personal history of other malignant neoplasm of skin: Secondary | ICD-10-CM | POA: Diagnosis not present

## 2017-02-08 DIAGNOSIS — X58XXXA Exposure to other specified factors, initial encounter: Secondary | ICD-10-CM | POA: Diagnosis not present

## 2017-02-08 DIAGNOSIS — Z9011 Acquired absence of right breast and nipple: Secondary | ICD-10-CM | POA: Insufficient documentation

## 2017-02-08 DIAGNOSIS — S32030A Wedge compression fracture of third lumbar vertebra, initial encounter for closed fracture: Secondary | ICD-10-CM | POA: Diagnosis not present

## 2017-02-08 DIAGNOSIS — Z79899 Other long term (current) drug therapy: Secondary | ICD-10-CM | POA: Insufficient documentation

## 2017-02-08 DIAGNOSIS — Z7982 Long term (current) use of aspirin: Secondary | ICD-10-CM | POA: Diagnosis not present

## 2017-02-08 HISTORY — PX: KYPHOPLASTY: SHX5884

## 2017-02-08 HISTORY — DX: Essential (primary) hypertension: I10

## 2017-02-08 HISTORY — DX: Malignant neoplasm of unspecified site of unspecified female breast: C50.919

## 2017-02-08 SURGERY — KYPHOPLASTY
Anesthesia: General | Site: Spine Lumbar | Wound class: Clean

## 2017-02-08 MED ORDER — METOCLOPRAMIDE HCL 10 MG PO TABS: 5.0000 mg | ORAL_TABLET | Freq: Three times a day (TID) | ORAL | Status: DC | PRN

## 2017-02-08 MED ORDER — MEPERIDINE HCL 50 MG/ML IJ SOLN: 6.2500 mg | INTRAMUSCULAR | Status: DC | PRN

## 2017-02-08 MED ORDER — ONDANSETRON HCL 4 MG PO TABS: 4.0000 mg | ORAL_TABLET | Freq: Four times a day (QID) | ORAL | Status: DC | PRN

## 2017-02-08 MED ORDER — PROMETHAZINE HCL 25 MG/ML IJ SOLN: 6.2500 mg | INTRAMUSCULAR | Status: DC | PRN

## 2017-02-08 MED ORDER — FAMOTIDINE 20 MG PO TABS
20.0000 mg | ORAL_TABLET | Freq: Once | ORAL | Status: AC
  Administered 2017-02-08: 20 mg via ORAL

## 2017-02-08 MED ORDER — ONDANSETRON HCL 4 MG/2ML IJ SOLN
INTRAMUSCULAR | Status: AC
  Filled 2017-02-08: qty 2

## 2017-02-08 MED ORDER — HYDROCODONE-ACETAMINOPHEN 5-325 MG PO TABS: 1.0000 | ORAL_TABLET | Freq: Four times a day (QID) | ORAL | 0 refills | Status: DC | PRN

## 2017-02-08 MED ORDER — BUPIVACAINE-EPINEPHRINE (PF) 0.5% -1:200000 IJ SOLN
INTRAMUSCULAR | Status: DC | PRN
  Administered 2017-02-08: 15 mL via PERINEURAL

## 2017-02-08 MED ORDER — FENTANYL CITRATE (PF) 100 MCG/2ML IJ SOLN
INTRAMUSCULAR | Status: AC
  Administered 2017-02-08: 25 ug via INTRAVENOUS
  Filled 2017-02-08: qty 2

## 2017-02-08 MED ORDER — LACTATED RINGERS IV SOLN
INTRAVENOUS | Status: DC
  Administered 2017-02-08 (×2): via INTRAVENOUS

## 2017-02-08 MED ORDER — PROPOFOL 10 MG/ML IV BOLUS
INTRAVENOUS | Status: DC | PRN
  Administered 2017-02-08: 10 mg via INTRAVENOUS
  Administered 2017-02-08: 20 mg via INTRAVENOUS
  Administered 2017-02-08: 10 mg via INTRAVENOUS
  Administered 2017-02-08: 20 mg via INTRAVENOUS
  Administered 2017-02-08: 10 mg via INTRAVENOUS

## 2017-02-08 MED ORDER — HYDROCODONE-ACETAMINOPHEN 5-325 MG PO TABS: 1.0000 | ORAL_TABLET | ORAL | Status: DC | PRN

## 2017-02-08 MED ORDER — FENTANYL CITRATE (PF) 100 MCG/2ML IJ SOLN
INTRAMUSCULAR | Status: AC
  Filled 2017-02-08: qty 2

## 2017-02-08 MED ORDER — LIDOCAINE HCL 1 % IJ SOLN
INTRAMUSCULAR | Status: DC | PRN
  Administered 2017-02-08: 25 mL

## 2017-02-08 MED ORDER — METOCLOPRAMIDE HCL 5 MG/ML IJ SOLN: 5.0000 mg | Freq: Three times a day (TID) | INTRAMUSCULAR | Status: DC | PRN

## 2017-02-08 MED ORDER — BUPIVACAINE-EPINEPHRINE (PF) 0.5% -1:200000 IJ SOLN
INTRAMUSCULAR | Status: AC
  Filled 2017-02-08: qty 30

## 2017-02-08 MED ORDER — CEFAZOLIN SODIUM-DEXTROSE 1-4 GM/50ML-% IV SOLN
1.0000 g | Freq: Once | INTRAVENOUS | Status: AC
  Administered 2017-02-08: 1 g via INTRAVENOUS

## 2017-02-08 MED ORDER — FENTANYL CITRATE (PF) 100 MCG/2ML IJ SOLN
INTRAMUSCULAR | Status: DC | PRN
  Administered 2017-02-08 (×4): 25 ug via INTRAVENOUS

## 2017-02-08 MED ORDER — SODIUM CHLORIDE 0.9 % IV SOLN: INTRAVENOUS | Status: DC

## 2017-02-08 MED ORDER — PROPOFOL 500 MG/50ML IV EMUL: INTRAVENOUS | Status: DC | PRN

## 2017-02-08 MED ORDER — LIDOCAINE HCL (PF) 1 % IJ SOLN
INTRAMUSCULAR | Status: AC
  Filled 2017-02-08: qty 30

## 2017-02-08 MED ORDER — CEFAZOLIN SODIUM-DEXTROSE 1-4 GM/50ML-% IV SOLN
INTRAVENOUS | Status: AC
  Filled 2017-02-08: qty 50

## 2017-02-08 MED ORDER — IOPAMIDOL (ISOVUE-M 200) INJECTION 41%
INTRAMUSCULAR | Status: AC
  Filled 2017-02-08: qty 20

## 2017-02-08 MED ORDER — MIDAZOLAM HCL 2 MG/2ML IJ SOLN
INTRAMUSCULAR | Status: AC
  Filled 2017-02-08: qty 2

## 2017-02-08 MED ORDER — FAMOTIDINE 20 MG PO TABS: 20.0000 mg | ORAL_TABLET | Freq: Once | ORAL | Status: DC

## 2017-02-08 MED ORDER — FAMOTIDINE 20 MG PO TABS
ORAL_TABLET | ORAL | Status: AC
  Filled 2017-02-08: qty 1

## 2017-02-08 MED ORDER — FENTANYL CITRATE (PF) 100 MCG/2ML IJ SOLN
25.0000 ug | INTRAMUSCULAR | Status: DC | PRN
  Administered 2017-02-08 (×4): 25 ug via INTRAVENOUS

## 2017-02-08 MED ORDER — ONDANSETRON HCL 4 MG/2ML IJ SOLN
4.0000 mg | Freq: Four times a day (QID) | INTRAMUSCULAR | Status: DC | PRN
  Administered 2017-02-08: 4 mg via INTRAVENOUS

## 2017-02-08 SURGICAL SUPPLY — 16 items
CEMENT KYPHON CX01A KIT/MIXER (Cement) ×3 IMPLANT
DERMABOND ADVANCED (GAUZE/BANDAGES/DRESSINGS) ×2
DERMABOND ADVANCED .7 DNX12 (GAUZE/BANDAGES/DRESSINGS) ×1 IMPLANT
DEVICE BIOPSY BONE KYPHX (INSTRUMENTS) ×3 IMPLANT
DRAPE C-ARM XRAY 36X54 (DRAPES) ×3 IMPLANT
DURAPREP 26ML APPLICATOR (WOUND CARE) ×3 IMPLANT
GLOVE SURG SYN 9.0  PF PI (GLOVE) ×2
GLOVE SURG SYN 9.0 PF PI (GLOVE) ×1 IMPLANT
GOWN SRG 2XL LVL 4 RGLN SLV (GOWNS) ×1 IMPLANT
GOWN STRL NON-REIN 2XL LVL4 (GOWNS) ×2
GOWN STRL REUS W/ TWL LRG LVL3 (GOWN DISPOSABLE) ×1 IMPLANT
GOWN STRL REUS W/TWL LRG LVL3 (GOWN DISPOSABLE) ×2
PACK KYPHOPLASTY (MISCELLANEOUS) ×3 IMPLANT
STRAP SAFETY BODY (MISCELLANEOUS) ×3 IMPLANT
TRAY KYPHOPAK 15/3 EXPRESS 1ST (MISCELLANEOUS) IMPLANT
TRAY KYPHOPAK 20/3 EXPRESS 1ST (MISCELLANEOUS) ×3 IMPLANT

## 2017-02-08 NOTE — Anesthesia Post-op Follow-up Note (Signed)
Anesthesia QCDR form completed.        

## 2017-02-08 NOTE — H&P (Signed)
Reviewed paper H+P, will be scanned into chart. No changes noted.  

## 2017-02-08 NOTE — Discharge Instructions (Addendum)
Take it easy today and resume more normal activities around the house tomorrow. Remove Band-Aid on Sunday okay to shower after that. Pain medicine as directed    AMBULATORY SURGERY  DISCHARGE INSTRUCTIONS   1) The drugs that you were given will stay in your system until tomorrow so for the next 24 hours you should not:  A) Drive an automobile B) Make any legal decisions C) Drink any alcoholic beverage   2) You may resume regular meals tomorrow.  Today it is better to start with liquids and gradually work up to solid foods.  You may eat anything you prefer, but it is better to start with liquids, then soup and crackers, and gradually work up to solid foods.   3) Please notify your doctor immediately if you have any unusual bleeding, trouble breathing, redness and pain at the surgery site, drainage, fever, or pain not relieved by medication.    4) Additional Instructions:        Please contact your physician with any problems or Same Day Surgery at [phone], Monday through Friday 6 am to 4 pm, or [place] at [facility] number at [phone].

## 2017-02-08 NOTE — Transfer of Care (Signed)
Immediate Anesthesia Transfer of Care Note  Patient: Shelia Stewart  Procedure(s) Performed: Ronna Polio (N/A Spine Lumbar)  Patient Location: PACU  Anesthesia Type:General  Level of Consciousness: sedated  Airway & Oxygen Therapy: Patient Spontanous Breathing and Patient connected to nasal cannula oxygen  Post-op Assessment: Report given to RN and Post -op Vital signs reviewed and stable  Post vital signs: Reviewed and stable  Last Vitals:  Vitals:   02/08/17 0946  BP: (!) 154/71  Pulse: 88  Resp: 16  Temp: (!) 36.3 C  SpO2: 97%    Last Pain:  Vitals:   02/08/17 0946  TempSrc: Oral  PainSc: 10-Worst pain ever         Complications: No apparent anesthesia complications

## 2017-02-08 NOTE — Anesthesia Postprocedure Evaluation (Signed)
Anesthesia Post Note  Patient: Shelia Stewart  Procedure(s) Performed: Ronna Polio (N/A Spine Lumbar)  Patient location during evaluation: PACU Anesthesia Type: General Level of consciousness: awake and alert and oriented Pain management: pain level controlled Vital Signs Assessment: post-procedure vital signs reviewed and stable Respiratory status: spontaneous breathing, nonlabored ventilation and respiratory function stable Cardiovascular status: blood pressure returned to baseline and stable Postop Assessment: no signs of nausea or vomiting Anesthetic complications: no     Last Vitals:  Vitals:   02/08/17 1315 02/08/17 1420  BP: (!) 158/69 (!) 171/61  Pulse: 87   Resp: 18 18  Temp: 36.9 C   SpO2: 97% 97%    Last Pain:  Vitals:   02/08/17 1315  TempSrc:   PainSc: 2                  Ninah Moccio

## 2017-02-08 NOTE — Op Note (Signed)
02/08/2017  12:27 PM  PATIENT:  Shelia Stewart  83 y.o. female  PRE-OPERATIVE DIAGNOSIS:  closed wedge compression fracture  POST-OPERATIVE DIAGNOSIS:  L4 Compressiom fracture  PROCEDURE:  Procedure(s): KYPHOPLASTY-L4 (N/A)  SURGEON: Laurene Footman, MD  ASSISTANTS: None  ANESTHESIA:   local and MAC  EBL:  Total I/O In: 500 [I.V.:500] Out: 5 [Blood:5]  BLOOD ADMINISTERED:none  DRAINS: none   LOCAL MEDICATIONS USED:  MARCAINE    and XYLOCAINE   SPECIMEN:  Source of Specimen:  L4 vertebral body  DISPOSITION OF SPECIMEN:  PATHOLOGY  COUNTS:  YES  TOURNIQUET:  * No tourniquets in log *  IMPLANTS: Bone cement  DICTATION: .Dragon Dictation  Patient brought the operating room and after adequate sedation was obtained the patient was placed prone and C-arm brought in with good visualization of  L4on the C-arm in both AP and lateral projections. After appropriate patient identification and timeout procedure local anesthetic was infiltratedon the rightat  L4. The back was then prepped and draped in sterile fashion and repeat timeout procedure carried out. Spinal needle was used to get local asthenic down to the pedicle on theright at L4. Small incision was then made and trocar advanced into the vertebral body and an extra pedicular fashion taking frequent C-arm views to make sure the neural foramen and spinal canal were not entered. Specimen was obtained during biopsy part of the procedure followed by drilling carried out followed by placement of balloon inflation ofL4  balloon to4 cc . Next the cement was mixed and was appropriate consistency it was used to fill the vertebral bodies with about6.0cc into L4with good fill and interdigitation. After the cement was set the trochar wasremoved and permanent C-arm views showed adequate position of the cement with good fill superior to inferior endplates medial and lateral. The wound wasclosed with Dermabond followed by  Band aid     PLAN OF CARE: Discharge to home after PACU  PATIENT DISPOSITION:  PACU - hemodynamically stable.

## 2017-02-08 NOTE — Anesthesia Preprocedure Evaluation (Signed)
Anesthesia Evaluation  Patient identified by MRN, date of birth, ID band Patient awake    Reviewed: Allergy & Precautions, NPO status , Patient's Chart, lab work & pertinent test results  History of Anesthesia Complications Negative for: history of anesthetic complications  Airway Mallampati: II  TM Distance: >3 FB Neck ROM: Full    Dental no notable dental hx.    Pulmonary neg pulmonary ROS, neg sleep apnea, neg COPD,    breath sounds clear to auscultation- rhonchi (-) wheezing      Cardiovascular Exercise Tolerance: Good hypertension, Pt. on medications (-) CAD, (-) Past MI and (-) Cardiac Stents  Rhythm:Regular Rate:Normal - Systolic murmurs and - Diastolic murmurs    Neuro/Psych negative neurological ROS  negative psych ROS   GI/Hepatic negative GI ROS, Neg liver ROS,   Endo/Other  negative endocrine ROSneg diabetes  Renal/GU negative Renal ROS     Musculoskeletal negative musculoskeletal ROS (+)   Abdominal (+) - obese,   Peds  Hematology negative hematology ROS (+)   Anesthesia Other Findings Past Medical History: No date: Breast cancer (HCC) No date: Hypertension   Reproductive/Obstetrics                             Anesthesia Physical Anesthesia Plan  ASA: II  Anesthesia Plan: General   Post-op Pain Management:    Induction: Intravenous  PONV Risk Score and Plan: 2 and Propofol infusion  Airway Management Planned: Natural Airway  Additional Equipment:   Intra-op Plan:   Post-operative Plan:   Informed Consent: I have reviewed the patients History and Physical, chart, labs and discussed the procedure including the risks, benefits and alternatives for the proposed anesthesia with the patient or authorized representative who has indicated his/her understanding and acceptance.   Dental advisory given  Plan Discussed with: CRNA and Anesthesiologist  Anesthesia  Plan Comments:         Anesthesia Quick Evaluation

## 2017-02-11 ENCOUNTER — Encounter: Payer: Self-pay | Admitting: Orthopedic Surgery

## 2017-02-11 LAB — SURGICAL PATHOLOGY

## 2017-02-18 ENCOUNTER — Ambulatory Visit
Admission: RE | Admit: 2017-02-18 | Discharge: 2017-02-18 | Disposition: A | Discharge status: Home / Self Care | Payer: Medicare Other | Source: Ambulatory Visit | Attending: Internal Medicine | Admitting: Internal Medicine

## 2017-02-18 DIAGNOSIS — I729 Aneurysm of unspecified site: Secondary | ICD-10-CM | POA: Diagnosis present

## 2017-02-18 DIAGNOSIS — N281 Cyst of kidney, acquired: Secondary | ICD-10-CM | POA: Insufficient documentation

## 2017-02-18 DIAGNOSIS — I700 Atherosclerosis of aorta: Secondary | ICD-10-CM | POA: Insufficient documentation

## 2020-07-10 ENCOUNTER — Emergency Department: Payer: Medicare Other

## 2020-07-10 ENCOUNTER — Encounter: Payer: Self-pay | Admitting: Emergency Medicine

## 2020-07-10 ENCOUNTER — Inpatient Hospital Stay
Admission: EM | Admit: 2020-07-10 | Discharge: 2020-07-13 | DRG: 375 | Disposition: A | Discharge status: Hospice | Payer: Medicare Other | Attending: Internal Medicine | Admitting: Internal Medicine

## 2020-07-10 ENCOUNTER — Inpatient Hospital Stay: Payer: Medicare Other

## 2020-07-10 ENCOUNTER — Encounter
Admission: EM | Disposition: A | Discharge status: Hospice | Payer: Self-pay | Source: Home / Self Care | Attending: Internal Medicine

## 2020-07-10 DIAGNOSIS — R531 Weakness: Secondary | ICD-10-CM

## 2020-07-10 DIAGNOSIS — K5900 Constipation, unspecified: Secondary | ICD-10-CM | POA: Diagnosis present

## 2020-07-10 DIAGNOSIS — R109 Unspecified abdominal pain: Secondary | ICD-10-CM

## 2020-07-10 DIAGNOSIS — F0390 Unspecified dementia without behavioral disturbance: Secondary | ICD-10-CM | POA: Diagnosis present

## 2020-07-10 DIAGNOSIS — D508 Other iron deficiency anemias: Secondary | ICD-10-CM | POA: Diagnosis present

## 2020-07-10 DIAGNOSIS — Z90710 Acquired absence of both cervix and uterus: Secondary | ICD-10-CM | POA: Diagnosis not present

## 2020-07-10 DIAGNOSIS — D649 Anemia, unspecified: Secondary | ICD-10-CM | POA: Diagnosis not present

## 2020-07-10 DIAGNOSIS — D509 Iron deficiency anemia, unspecified: Secondary | ICD-10-CM | POA: Diagnosis not present

## 2020-07-10 DIAGNOSIS — Z87892 Personal history of anaphylaxis: Secondary | ICD-10-CM

## 2020-07-10 DIAGNOSIS — R1084 Generalized abdominal pain: Secondary | ICD-10-CM

## 2020-07-10 DIAGNOSIS — K6389 Other specified diseases of intestine: Secondary | ICD-10-CM

## 2020-07-10 DIAGNOSIS — Z853 Personal history of malignant neoplasm of breast: Secondary | ICD-10-CM

## 2020-07-10 DIAGNOSIS — Z20822 Contact with and (suspected) exposure to covid-19: Secondary | ICD-10-CM | POA: Diagnosis present

## 2020-07-10 DIAGNOSIS — Z9010 Acquired absence of unspecified breast and nipple: Secondary | ICD-10-CM

## 2020-07-10 DIAGNOSIS — Z515 Encounter for palliative care: Secondary | ICD-10-CM | POA: Diagnosis not present

## 2020-07-10 DIAGNOSIS — Z7982 Long term (current) use of aspirin: Secondary | ICD-10-CM | POA: Diagnosis not present

## 2020-07-10 DIAGNOSIS — C180 Malignant neoplasm of cecum: Secondary | ICD-10-CM | POA: Diagnosis not present

## 2020-07-10 DIAGNOSIS — Z79899 Other long term (current) drug therapy: Secondary | ICD-10-CM

## 2020-07-10 DIAGNOSIS — W19XXXA Unspecified fall, initial encounter: Secondary | ICD-10-CM | POA: Diagnosis present

## 2020-07-10 DIAGNOSIS — E785 Hyperlipidemia, unspecified: Secondary | ICD-10-CM | POA: Diagnosis present

## 2020-07-10 DIAGNOSIS — S32020A Wedge compression fracture of second lumbar vertebra, initial encounter for closed fracture: Secondary | ICD-10-CM | POA: Diagnosis present

## 2020-07-10 DIAGNOSIS — R1031 Right lower quadrant pain: Secondary | ICD-10-CM

## 2020-07-10 DIAGNOSIS — Z9181 History of falling: Secondary | ICD-10-CM

## 2020-07-10 DIAGNOSIS — D630 Anemia in neoplastic disease: Secondary | ICD-10-CM | POA: Diagnosis present

## 2020-07-10 DIAGNOSIS — T148XXA Other injury of unspecified body region, initial encounter: Secondary | ICD-10-CM

## 2020-07-10 DIAGNOSIS — R32 Unspecified urinary incontinence: Secondary | ICD-10-CM | POA: Diagnosis present

## 2020-07-10 DIAGNOSIS — Z885 Allergy status to narcotic agent status: Secondary | ICD-10-CM | POA: Diagnosis not present

## 2020-07-10 DIAGNOSIS — M4856XA Collapsed vertebra, not elsewhere classified, lumbar region, initial encounter for fracture: Secondary | ICD-10-CM | POA: Diagnosis present

## 2020-07-10 DIAGNOSIS — I10 Essential (primary) hypertension: Secondary | ICD-10-CM | POA: Diagnosis present

## 2020-07-10 DIAGNOSIS — Z888 Allergy status to other drugs, medicaments and biological substances status: Secondary | ICD-10-CM

## 2020-07-10 DIAGNOSIS — S32010A Wedge compression fracture of first lumbar vertebra, initial encounter for closed fracture: Secondary | ICD-10-CM

## 2020-07-10 DIAGNOSIS — H9190 Unspecified hearing loss, unspecified ear: Secondary | ICD-10-CM | POA: Diagnosis present

## 2020-07-10 DIAGNOSIS — D638 Anemia in other chronic diseases classified elsewhere: Secondary | ICD-10-CM | POA: Diagnosis not present

## 2020-07-10 DIAGNOSIS — M549 Dorsalgia, unspecified: Secondary | ICD-10-CM | POA: Diagnosis present

## 2020-07-10 DIAGNOSIS — Z66 Do not resuscitate: Secondary | ICD-10-CM | POA: Diagnosis present

## 2020-07-10 DIAGNOSIS — W19XXXD Unspecified fall, subsequent encounter: Secondary | ICD-10-CM | POA: Diagnosis not present

## 2020-07-10 DIAGNOSIS — M545 Low back pain, unspecified: Secondary | ICD-10-CM | POA: Diagnosis not present

## 2020-07-10 LAB — RESP PANEL BY RT-PCR (FLU A&B, COVID) ARPGX2
Influenza A by PCR: NEGATIVE
Influenza B by PCR: NEGATIVE
SARS Coronavirus 2 by RT PCR: NEGATIVE

## 2020-07-10 LAB — URINALYSIS, COMPLETE (UACMP) WITH MICROSCOPIC
Bilirubin Urine: NEGATIVE
Glucose, UA: NEGATIVE mg/dL
Hgb urine dipstick: NEGATIVE
Ketones, ur: NEGATIVE mg/dL
Leukocytes,Ua: NEGATIVE
Nitrite: NEGATIVE
Protein, ur: NEGATIVE mg/dL
Specific Gravity, Urine: 1.021 (ref 1.005–1.030)
pH: 5 (ref 5.0–8.0)

## 2020-07-10 LAB — COMPREHENSIVE METABOLIC PANEL
ALT: 8 U/L (ref 0–44)
AST: 12 U/L — ABNORMAL LOW (ref 15–41)
Albumin: 2.9 g/dL — ABNORMAL LOW (ref 3.5–5.0)
Alkaline Phosphatase: 53 U/L (ref 38–126)
Anion gap: 6 (ref 5–15)
BUN: 17 mg/dL (ref 8–23)
CO2: 26 mmol/L (ref 22–32)
Calcium: 8.4 mg/dL — ABNORMAL LOW (ref 8.9–10.3)
Chloride: 101 mmol/L (ref 98–111)
Creatinine, Ser: 0.58 mg/dL (ref 0.44–1.00)
GFR, Estimated: >60 mL/min (ref >60)
Glucose, Bld: 112 mg/dL — ABNORMAL HIGH (ref 70–99)
Potassium: 3.8 mmol/L (ref 3.5–5.1)
Sodium: 133 mmol/L — ABNORMAL LOW (ref 135–145)
Total Bilirubin: 0.9 mg/dL (ref 0.3–1.2)
Total Protein: 5.8 g/dL — ABNORMAL LOW (ref 6.5–8.1)

## 2020-07-10 LAB — CBC WITH DIFFERENTIAL/PLATELET
Abs Immature Granulocytes: 0.03 10*3/uL (ref 0.00–0.07)
Basophils Absolute: 0 10*3/uL (ref 0.0–0.1)
Basophils Relative: 1 %
Eosinophils Absolute: 0.2 10*3/uL (ref 0.0–0.5)
Eosinophils Relative: 2 %
HCT: 25.5 % — ABNORMAL LOW (ref 36.0–46.0)
Hemoglobin: 7.8 g/dL — ABNORMAL LOW (ref 12.0–15.0)
Immature Granulocytes: 0 %
Lymphocytes Relative: 12 %
Lymphs Abs: 0.9 10*3/uL (ref 0.7–4.0)
MCH: 22.1 pg — ABNORMAL LOW (ref 26.0–34.0)
MCHC: 30.6 g/dL (ref 30.0–36.0)
MCV: 72.2 fL — ABNORMAL LOW (ref 80.0–100.0)
Monocytes Absolute: 0.6 10*3/uL (ref 0.1–1.0)
Monocytes Relative: 8 %
Neutro Abs: 6 10*3/uL (ref 1.7–7.7)
Neutrophils Relative %: 77 %
Platelets: 348 10*3/uL (ref 150–400)
RBC: 3.53 MIL/uL — ABNORMAL LOW (ref 3.87–5.11)
RDW: 18.7 % — ABNORMAL HIGH (ref 11.5–15.5)
WBC: 7.8 10*3/uL (ref 4.0–10.5)
nRBC: 0 % (ref 0.0–0.2)

## 2020-07-10 LAB — LIPASE, BLOOD: Lipase: 27 U/L (ref 11–51)

## 2020-07-10 LAB — FERRITIN: Ferritin: 21 ng/mL (ref 11–307)

## 2020-07-10 LAB — IRON AND TIBC
Iron: 12 ug/dL — ABNORMAL LOW (ref 28–170)
Saturation Ratios: 4 % — ABNORMAL LOW (ref 10.4–31.8)
TIBC: 322 ug/dL (ref 250–450)
UIBC: 310 ug/dL

## 2020-07-10 LAB — TROPONIN I (HIGH SENSITIVITY): Troponin I (High Sensitivity): 6 ng/L (ref <18)

## 2020-07-10 MED ORDER — MORPHINE SULFATE (PF) 2 MG/ML IV SOLN: 1.0000 mg | INTRAVENOUS | Status: DC | PRN

## 2020-07-10 MED ORDER — POLYETHYLENE GLYCOL 3350 17 G PO PACK
17.0000 g | PACK | Freq: Every day | ORAL | Status: DC
  Administered 2020-07-11 – 2020-07-13 (×3): 17 g via ORAL
  Filled 2020-07-10 (×3): qty 1

## 2020-07-10 MED ORDER — PRAVASTATIN SODIUM 20 MG PO TABS
40.0000 mg | ORAL_TABLET | Freq: Every day | ORAL | Status: DC
  Administered 2020-07-10 – 2020-07-12 (×3): 40 mg via ORAL
  Filled 2020-07-10: qty 2
  Filled 2020-07-10: qty 1
  Filled 2020-07-10: qty 2
  Filled 2020-07-10: qty 1
  Filled 2020-07-10: qty 2
  Filled 2020-07-10: qty 1

## 2020-07-10 MED ORDER — VITAMIN D 25 MCG (1000 UNIT) PO TABS
1000.0000 [IU] | ORAL_TABLET | Freq: Every day | ORAL | Status: DC
  Administered 2020-07-10 – 2020-07-13 (×4): 1000 [IU] via ORAL
  Filled 2020-07-10 (×4): qty 1

## 2020-07-10 MED ORDER — ONDANSETRON HCL 4 MG/2ML IJ SOLN
4.0000 mg | Freq: Once | INTRAMUSCULAR | Status: AC
  Administered 2020-07-10: 4 mg via INTRAVENOUS
  Filled 2020-07-10: qty 2

## 2020-07-10 MED ORDER — ENOXAPARIN SODIUM 30 MG/0.3ML ~~LOC~~ SOLN
30.0000 mg | SUBCUTANEOUS | Status: DC
  Administered 2020-07-10: 30 mg via SUBCUTANEOUS
  Filled 2020-07-10: qty 0.3

## 2020-07-10 MED ORDER — PANTOPRAZOLE SODIUM 40 MG PO TBEC
40.0000 mg | DELAYED_RELEASE_TABLET | Freq: Every day | ORAL | Status: DC
  Administered 2020-07-10 – 2020-07-13 (×4): 40 mg via ORAL
  Filled 2020-07-10 (×4): qty 1

## 2020-07-10 MED ORDER — IOHEXOL 300 MG/ML  SOLN
100.0000 mL | Freq: Once | INTRAMUSCULAR | Status: AC | PRN
  Administered 2020-07-10: 100 mL via INTRAVENOUS

## 2020-07-10 MED ORDER — HYDROCODONE-ACETAMINOPHEN 5-325 MG PO TABS
1.0000 | ORAL_TABLET | Freq: Four times a day (QID) | ORAL | Status: DC | PRN
  Administered 2020-07-10 – 2020-07-11 (×3): 1 via ORAL
  Filled 2020-07-10 (×4): qty 1

## 2020-07-10 MED ORDER — OCUVITE-LUTEIN PO TABS
1.0000 | ORAL_TABLET | Freq: Every day | ORAL | Status: DC
  Administered 2020-07-11 – 2020-07-12 (×2): 1 via ORAL
  Filled 2020-07-10 (×5): qty 1

## 2020-07-10 MED ORDER — ONDANSETRON HCL 4 MG PO TABS: 4.0000 mg | ORAL_TABLET | Freq: Four times a day (QID) | ORAL | Status: DC | PRN

## 2020-07-10 MED ORDER — ONDANSETRON HCL 4 MG/2ML IJ SOLN: 4.0000 mg | Freq: Four times a day (QID) | INTRAMUSCULAR | Status: DC | PRN

## 2020-07-10 MED ORDER — POLYVINYL ALCOHOL 1.4 % OP SOLN
1.0000 [drp] | Freq: Three times a day (TID) | OPHTHALMIC | Status: DC | PRN
  Filled 2020-07-10: qty 15

## 2020-07-10 MED ORDER — HYDROCHLOROTHIAZIDE 25 MG PO TABS
25.0000 mg | ORAL_TABLET | Freq: Every day | ORAL | Status: DC
  Administered 2020-07-11 – 2020-07-13 (×3): 25 mg via ORAL
  Filled 2020-07-10 (×3): qty 1

## 2020-07-10 MED ORDER — MAGNESIUM GLUCONATE 500 MG PO TABS
250.0000 mg | ORAL_TABLET | Freq: Every day | ORAL | Status: DC
  Administered 2020-07-10 – 2020-07-13 (×4): 250 mg via ORAL
  Filled 2020-07-10 (×5): qty 1

## 2020-07-10 MED ORDER — MORPHINE SULFATE (PF) 4 MG/ML IV SOLN
4.0000 mg | Freq: Once | INTRAVENOUS | Status: AC
  Administered 2020-07-10: 4 mg via INTRAVENOUS
  Filled 2020-07-10: qty 1

## 2020-07-10 MED ORDER — VITAMIN D (ERGOCALCIFEROL) 1.25 MG (50000 UNIT) PO CAPS
50000.0000 [IU] | ORAL_CAPSULE | ORAL | Status: DC
  Administered 2020-07-11: 50000 [IU] via ORAL
  Filled 2020-07-10: qty 1

## 2020-07-10 MED ORDER — LACTATED RINGERS IV BOLUS
1000.0000 mL | Freq: Once | INTRAVENOUS | Status: AC
  Administered 2020-07-10: 1000 mL via INTRAVENOUS

## 2020-07-10 MED ORDER — ENOXAPARIN SODIUM 40 MG/0.4ML ~~LOC~~ SOLN: 40.0000 mg | SUBCUTANEOUS | Status: DC

## 2020-07-10 MED ORDER — BISACODYL 10 MG RE SUPP
10.0000 mg | Freq: Once | RECTAL | Status: AC
  Administered 2020-07-10: 10 mg via RECTAL
  Filled 2020-07-10 (×2): qty 1

## 2020-07-10 MED ORDER — ACETAMINOPHEN 325 MG PO TABS: 650.0000 mg | ORAL_TABLET | Freq: Four times a day (QID) | ORAL | Status: DC | PRN

## 2020-07-10 NOTE — ED Provider Notes (Signed)
[facility] Emergency Department Provider Note   ____________________________________________   Event Date/Time   First MD Initiated Contact with Patient 07/10/20 1048     (approximate)  I have reviewed the triage vital signs and the nursing notes.   HISTORY  Chief Complaint Weakness    HPI Shelia Stewart is a 87 y.o. female with past medical history of hypertension, hyperlipidemia, breast cancer, and dementia who presents to the ED for weakness.  History is limited due to patient's dementia and being extremely hard of hearing.  EMS states that patient had a fall 6 days ago and has been deteriorating since then.  She had complained of back pain, has been able to walk less and less over the past few days.  She has spent the past 24 hours sitting in her recliner, found to be incontinent of urine and stool by EMS.  Patient in significant pain upon being moved from EMS to ED stretcher, but is unable to localize pain.  She states that she has been constipated and has not had a bowel movement in the past 5 days.        Past Medical History:  Diagnosis Date  . Breast cancer ([place])   . Hypertension     There are no problems to display for this patient.   Past Surgical History:  Procedure Laterality Date  . ABDOMINAL HYSTERECTOMY    . BACK SURGERY    . BREAST SURGERY    . KYPHOPLASTY N/A 02/08/2017   Procedure: [id number];  Surgeon: Hessie Knows, MD;  Location: [facility] ORS;  Service: Orthopedics;  Laterality: N/A;  . MASTECTOMY      Prior to Admission medications   Medication Sig Start Date End Date Taking? Authorizing Provider  acetaminophen (TYLENOL) 325 MG tablet Take 650 mg every 6 (six) hours as needed by mouth (for pain.).    [provider]  aspirin EC 81 MG tablet Take 81 mg daily by mouth.    [provider]  beta carotene w/minerals (OCUVITE) tablet Take 1 tablet daily by mouth.    [provider]   cholecalciferol (VITAMIN D) 1000 units tablet Take 1,000 Units daily by mouth.    [provider]  hydrochlorothiazide (HYDRODIURIL) 25 MG tablet Take 25 mg daily by mouth.    [provider]  HYDROcodone-acetaminophen (NORCO) 5-325 MG tablet Take 1 tablet every 6 (six) hours as needed by mouth for moderate pain. 02/08/17   Hessie Knows, MD  Magnesium 250 MG TABS Take 250 mg daily by mouth.    [provider]  omeprazole (PRILOSEC) 20 MG capsule Take 20 mg daily before breakfast by mouth.    [provider]  polyvinyl alcohol (LUBRICANT DROPS) 1.4 % ophthalmic solution Place 1-2 drops 3 (three) times daily as needed into both eyes for dry eyes.    [provider]  pravastatin (PRAVACHOL) 40 MG tablet Take 40 mg at bedtime by mouth. 09/14/15   [provider]  Vitamin D, Ergocalciferol, (DRISDOL) 50000 units CAPS capsule Take 50,000 Units every Monday by mouth.    [provider]    Allergies Codeine and Alendronate  History reviewed. No pertinent family history.  Social History Social History   Tobacco Use  . Smoking status: Never Smoker  Substance Use Topics  . Alcohol use: No  . Drug use: No    Review of Systems Unable to obtain secondary to dementia and difficulty hearing.  ____________________________________________   PHYSICAL EXAM:  VITAL  SIGNS: ED Triage Vitals [07/10/20 1048]  Enc Vitals Group     BP (!) 186/95     Pulse Rate (!) 113     Resp 20     Temp 97.9 F (36.6 C)     Temp Source Oral     SpO2 93 %     Weight      Height      Head Circumference      Peak Flow      Pain Score      Pain Loc      Pain Edu?      Excl. in [place]?     Constitutional: Awake and alert. Eyes: Conjunctivae are normal. Head: Atraumatic. Nose: No congestion/rhinnorhea. Mouth/Throat: Mucous membranes are moist. Neck: Normal ROM, no midline cervical spine tenderness. Cardiovascular: Tachycardic, regular rhythm.  Grossly normal heart sounds. Respiratory: Normal respiratory effort.  No retractions. Lungs CTAB.  No chest wall tenderness to palpation. Gastrointestinal: Distended abdomen that is diffusely tender to palpation. Genitourinary: deferred Musculoskeletal: No lower extremity tenderness nor edema.  No upper extremity bony tenderness to palpation.  Midline lumbar spinal tenderness to palpation. Neurologic:  Normal speech and language. No gross focal neurologic deficits are appreciated. Skin:  Skin is warm, dry and intact. No rash noted. Psychiatric: Mood and affect are normal. Speech and behavior are normal.  ____________________________________________   LABS (all labs ordered are listed, but only abnormal results are displayed)  Labs Reviewed  CBC WITH DIFFERENTIAL/PLATELET - Abnormal; Notable for the following components:      Result Value   RBC 3.53 (*)    Hemoglobin 7.8 (*)    HCT 25.5 (*)    MCV 72.2 (*)    MCH 22.1 (*)    RDW 18.7 (*)    All other components within normal limits  COMPREHENSIVE METABOLIC PANEL - Abnormal; Notable for the following components:   Sodium 133 (*)    Glucose, Bld 112 (*)    Calcium 8.4 (*)    Total Protein 5.8 (*)    Albumin 2.9 (*)    AST 12 (*)    All other components within normal limits  URINALYSIS, COMPLETE (UACMP) WITH MICROSCOPIC - Abnormal; Notable for the following components:   Color, Urine YELLOW (*)    APPearance CLEAR (*)    Bacteria, UA RARE (*)    All other components within normal limits  RESP PANEL BY RT-PCR (FLU A&B, COVID) ARPGX2  LIPASE, BLOOD  CEA  IRON AND TIBC  FERRITIN  TROPONIN I (HIGH SENSITIVITY)   ____________________________________________  EKG  ED ECG REPORT I, Blake Divine, the attending physician, personally viewed and interpreted this ECG.   Date: 07/10/2020  EKG Time: 11:02  Rate: 206  Rhythm: svt  Axis: Normal  Intervals:none  ST&T Change: None  ED ECG REPORT I, Blake Divine, the  attending physician, personally viewed and interpreted this ECG.   Date: 07/10/2020  EKG Time: 11:10  Rate: 111  Rhythm: sinus tachycardia  Axis: Normal  Intervals:none  ST&T Change: None    PROCEDURES  Procedure(s) performed (including Critical Care):  Procedures   ____________________________________________   INITIAL IMPRESSION / ASSESSMENT AND PLAN / ED COURSE       87 year old female with past medical history of hypertension, hyperlipidemia, breast cancer, and dementia who presents to the ED for increasing weakness over the past 5 days after she had a fall.  Patient and significant pain on arrival and the primary source of this appears to be her  abdomen.  She is diffusely distended and tender, we will initially assess with x-ray along with x-rays of her chest and lower back, but presentation is concerning for bowel obstruction versus volvulus.  She is now vomiting and when she starts to vomit, rhythm changes to what appears to be SVT.  This returned to sinus tachycardia after she is done vomiting, we will hydrate with IV fluids and treat with Zofran for now.  UA shows no signs of infection, labs are pending.  Labs are unremarkable, chest x-ray reviewed by me and shows no infiltrate, edema, or effusion.  X-rays of lumbar spine do show possibly acute lumbar compression fractures, abdominal x-ray also shows gaseous distention concerning for obstruction versus ileus.  CT scan was performed and shows distended colon along with apparent mass in her cecum.  Findings discussed with oncology, who will see patient in consultation during admission, they also recommend surgical consultation.  Case discussed with Dr. Christian Mate of general surgery, who will evaluate the patient.  Given patient's inability to ambulate along with significant ongoing abdominal pain, case discussed with hospitalist for admission.      ____________________________________________   FINAL CLINICAL IMPRESSION(S) /  ED DIAGNOSES  Final diagnoses:  Generalized abdominal pain  Colonic mass  Generalized weakness     ED Discharge Orders    None       Note:  This document was prepared using Dragon voice recognition software and may include unintentional dictation errors.   Blake Divine, MD 07/10/20 1432

## 2020-07-10 NOTE — H&P (Signed)
History and Physical    Shelia Stewart MCN:[id number] DOB: 1925/10/24 DOA: 07/10/2020  PCP: Tracie Harrier, MD   Patient coming from: Home  I have personally briefly reviewed patient's old medical records in [place]  Chief Complaint: Low back pain                                Weakness  Most of the history was obtained from ER notes and patient's daughter Shelia Stewart over the phone  HPI: Shelia Stewart is a 87 y.o. female with medical history significant for breast cancer status post mastectomy and hypertension who presents to the emergency room via EMS for evaluation of low back pain and difficulty with ambulation for couple of days. According to patient's daughter, she found her mother on the floor on Monday, 07/04/20.  She was able to help her get up and patient was able to ambulate with assistance to the bathroom which is her baseline. 2 days prior to her hospitalization patient appeared to be very weak and has been unable to ambulate and spent the whole day in the recliner 1 day prior to her admission.  She is afraid to get up due to pain in her back. Upon arrival to the ER she was noted to have a distended abdomen and a palpable mass in the right lumbar/lower quadrant.  Her daughter states that she has not had a bowel movement in a week which is not unusual for her since she takes stool softeners at home.  Her daughter also states that she has not had any nausea or vomiting. I am unable to do review of systems for this patient because she is very hard of hearing despite having hearing aids in place. Labs show sodium 133, potassium 3.8, chloride 101, bicarb 26, glucose 112, BUN 17, creatinine 0.58, calcium 8.4, alkaline phosphatase 53, albumin 2.9, lipase 27, AST 12, ALT 8, total protein 5.8, troponin 6, white count 7.8, hemoglobin 7.8, hematocrit 25.5, MCV 72.2, RDW 18.7, platelet count 348 Respiratory viral panel is negative Lumbar spine x-ray shows  possible compression fractures of L1 and L2. Consider further evaluation with CT of the lumbar spine. Remote vertebral augmentation at T12 and L4. Chest x-ray reviewed by me shows no acute findings. No evidence of pneumonia or pulmonary edema.  Abdominal x-ray shows moderately distended gas-filled loops of small bowel within the central/LEFT abdomen. Findings could represent a partial small bowel obstruction or ileus. CT scan of abdomen and pelvis shows  Irregular mass-like thickening within the cecum, measuring 4.6 cm greatest dimension and approximately 2 cm thickness, located within the cecum adjacent to the terminal ileum, highly suspicious for malignancy. Consider further characterization with nonemergent colonoscopy. No evidence of bowel obstruction. Moderate amount of stool and gas throughout the mildly distended colon suggesting constipation or mild colonic ileus. No small bowel dilatation. Colonic diverticulosis without evidence of acute diverticulitis. Bilateral nephrolithiasis. No ureteral or bladder calculi. No hydronephrosis. Twelve-lead EKG reviewed by me shows sinus tachycardia with an incomplete left bundle branch block   ED Course: Patient is a 87 year old female who was brought into the ER by EMS for evaluation of difficulty with ambulation as well as low back pain following a fall almost a week ago.  Patient was noted to have a distended abdomen in the emergency room and imaging shows a mass in the cecum adjacent to the terminal ileum. X-ray of the lumbar spine shows  compression fractures involving L1 and L2 Patient will be admitted to the hospital for further evaluation.    Review of Systems: As per HPI otherwise all other systems reviewed and negative.    Past Medical History:  Diagnosis Date  . Breast cancer ([place])   . Hypertension     Past Surgical History:  Procedure Laterality Date  . ABDOMINAL HYSTERECTOMY    . BACK SURGERY    . BREAST SURGERY    .  KYPHOPLASTY N/A 02/08/2017   Procedure: [id number];  Surgeon: Hessie Knows, MD;  Location: [facility] ORS;  Service: Orthopedics;  Laterality: N/A;  . MASTECTOMY       reports that she has never smoked. She does not have any smokeless tobacco history on file. She reports that she does not drink alcohol and does not use drugs.  Allergies  Allergen Reactions  . Codeine Anaphylaxis  . Alendronate Other (See Comments)    GI upset.    History reviewed. No pertinent family history.    Prior to Admission medications   Medication Sig Start Date End Date Taking? Authorizing Provider  acetaminophen (TYLENOL) 325 MG tablet Take 650 mg every 6 (six) hours as needed by mouth (for pain.).    [provider]  aspirin EC 81 MG tablet Take 81 mg daily by mouth.    [provider]  beta carotene w/minerals (OCUVITE) tablet Take 1 tablet daily by mouth.    [provider]  cholecalciferol (VITAMIN D) 1000 units tablet Take 1,000 Units daily by mouth.    [provider]  hydrochlorothiazide (HYDRODIURIL) 25 MG tablet Take 25 mg daily by mouth.    [provider]  HYDROcodone-acetaminophen (NORCO) 5-325 MG tablet Take 1 tablet every 6 (six) hours as needed by mouth for moderate pain. 02/08/17   Hessie Knows, MD  Magnesium 250 MG TABS Take 250 mg daily by mouth.    [provider]  omeprazole (PRILOSEC) 20 MG capsule Take 20 mg daily before breakfast by mouth.    [provider]  polyvinyl alcohol (LUBRICANT DROPS) 1.4 % ophthalmic solution Place 1-2 drops 3 (three) times daily as needed into both eyes for dry eyes.    [provider]  pravastatin (PRAVACHOL) 40 MG tablet Take 40 mg at bedtime by mouth. 09/14/15   [provider]  Vitamin D, Ergocalciferol, (DRISDOL) 50000 units CAPS capsule Take 50,000 Units every Monday by mouth.    [provider]    Physical Exam: Vitals:   07/10/20 1048 07/10/20 1238 07/10/20  1300 07/10/20 1330  BP: (!) 186/95 (!) 146/74 (!) 162/82 (!) 158/74  Pulse: (!) 113 98 (!) 114 (!) 105  Resp: 20 13 19 15   Temp: 97.9 F (36.6 C)     TempSrc: Oral     SpO2: 93% 92% 92% 93%  Weight: 49.9 kg     Height: 5\' 2"  (1.575 m)        Vitals:   07/10/20 1048 07/10/20 1238 07/10/20 1300 07/10/20 1330  BP: (!) 186/95 (!) 146/74 (!) 162/82 (!) 158/74  Pulse: (!) 113 98 (!) 114 (!) 105  Resp: 20 13 19 15   Temp: 97.9 F (36.6 C)     TempSrc: Oral     SpO2: 93% 92% 92% 93%  Weight: 49.9 kg     Height: 5\' 2"  (1.575 m)         Constitutional: Alert and oriented to person. Not in any apparent distress.  Very hard of hearing despite having  hearing aids in place HEENT:      Head: Normocephalic and atraumatic.         Eyes: PERLA, EOMI, Conjunctivae pallor. Sclera is non-icteric.       Mouth/Throat: Mucous membranes are dry.       Neck: Supple with no signs of meningismus. Cardiovascular:  Tachycardic. No murmurs, gallops, or rubs. 2+ symmetrical distal pulses are present . No JVD. No LE edema Respiratory: Respiratory effort normal .Lungs sounds clear bilaterally. No wheezes, crackles, or rhonchi.  Gastrointestinal: Soft, distended, palpable mass over the right lumbar/lower quadrant, tender to palpation, and non distended with positive bowel sounds.  Genitourinary: No CVA tenderness. Musculoskeletal: Nontender with normal range of motion in all extremities. No cyanosis, or erythema of extremities. Neurologic:  Face is symmetric. Moving all extremities.  Unable to assess. Skin: Skin is warm, dry.  No rash or ulcers Psychiatric: Mood and affect are normal   Labs on Admission: I have personally reviewed following labs and imaging studies  CBC: Recent Labs  Lab 07/10/20 1055  WBC 7.8  NEUTROABS 6.0  HGB 7.8*  HCT 25.5*  MCV 72.2*  PLT 353   Basic Metabolic Panel: Recent Labs  Lab 07/10/20 1055  NA 133*  K 3.8  CL 101  CO2 26  GLUCOSE 112*  BUN 17  CREATININE  0.58  CALCIUM 8.4*   GFR: Estimated Creatinine Clearance: 34.6 mL/min (by C-G formula based on SCr of 0.58 mg/dL). Liver Function Tests: Recent Labs  Lab 07/10/20 1055  AST 12*  ALT 8  ALKPHOS 53  BILITOT 0.9  PROT 5.8*  ALBUMIN 2.9*   Recent Labs  Lab 07/10/20 1055  LIPASE 27   No results for input(s): AMMONIA in the last 168 hours. Coagulation Profile: No results for input(s): INR, PROTIME in the last 168 hours. Cardiac Enzymes: No results for input(s): CKTOTAL, CKMB, CKMBINDEX, TROPONINI in the last 168 hours. BNP (last 3 results) No results for input(s): PROBNP in the last 8760 hours. HbA1C: No results for input(s): HGBA1C in the last 72 hours. CBG: No results for input(s): GLUCAP in the last 168 hours. Lipid Profile: No results for input(s): CHOL, HDL, LDLCALC, TRIG, CHOLHDL, LDLDIRECT in the last 72 hours. Thyroid Function Tests: No results for input(s): TSH, T4TOTAL, FREET4, T3FREE, THYROIDAB in the last 72 hours. Anemia Panel: No results for input(s): VITAMINB12, FOLATE, FERRITIN, TIBC, IRON, RETICCTPCT in the last 72 hours. Urine analysis:    Component Value Date/Time   COLORURINE YELLOW (A) 07/10/2020 1055   APPEARANCEUR CLEAR (A) 07/10/2020 1055   APPEARANCEUR Clear 10/30/2012 0944   LABSPEC 1.021 07/10/2020 1055   LABSPEC 1.013 10/30/2012 0944   PHURINE 5.0 07/10/2020 1055   GLUCOSEU NEGATIVE 07/10/2020 1055   GLUCOSEU Negative 10/30/2012 0944   HGBUR NEGATIVE 07/10/2020 [place] 07/10/2020 1055   BILIRUBINUR Negative 10/30/2012 [place] 07/10/2020 1055   PROTEINUR NEGATIVE 07/10/2020 1055   UROBILINOGEN 1.0 11/16/2009 2243   NITRITE NEGATIVE 07/10/2020 1055   LEUKOCYTESUR NEGATIVE 07/10/2020 1055   LEUKOCYTESUR 1+ 10/30/2012 0944    Radiological Exams on Admission: DG Chest 2 View  Result Date: 07/10/2020 CLINICAL DATA:  Fall, low back pain and weakness. EXAM: CHEST - 2 VIEW COMPARISON:  Chest x-ray dated  10/08/2015. FINDINGS: Heart size and mediastinal contours are stable. Chronic bronchitic changes centrally. No confluent opacity to suggest a developing pneumonia. No pleural effusion or pneumothorax is seen. No acute appearing osseous abnormality. Degenerative spondylosis of the slightly  kyphotic thoracic spine. IMPRESSION: No acute findings. No evidence of pneumonia or pulmonary edema. Electronically Signed   By: Franki Cabot M.D.   On: 07/10/2020 12:50   DG Lumbar Spine 2-3 Views  Result Date: 07/10/2020 CLINICAL DATA:  [place] on Monday. LOWER back pain and weakness since fall. EXAM: LUMBAR SPINE - 2-3 VIEW COMPARISON:  02/08/2017 FINDINGS: There is normal alignment of the lumbar spine. Patient has had prior vertebral augmentation at T12 and L4. There are chronic superior endplate fractures of L3 and L5. There is question of anterior cortical discontinuity involving the L1 and L2 vertebral bodies. Detail limited given presence of significant overlying bowel gas. Nonobstructive bowel gas pattern. There is atherosclerotic calcification of the abdominal aorta. Remote LEFT hip ORIF IMPRESSION: Possible compression fractures of L1 and L2. Consider further evaluation with CT of the lumbar spine. Remote vertebral augmentation at T12 and L4. Electronically Signed   By: Nolon Nations M.D.   On: 07/10/2020 12:46   DG Abdomen 1 View  Result Date: 07/10/2020 CLINICAL DATA:  Abdominal pain, fall on Monday, low back pain and weakness since. Abdominal distension. EXAM: ABDOMEN - 1 VIEW COMPARISON:  Plain film of the abdomen dated 11/22/2009. FINDINGS: Moderately distended gas-filled loops of small bowel within the central/LEFT abdomen. Air is seen within the colon. No evidence of free intraperitoneal air. Degenerative spondylosis of the scoliotic thoracolumbar spine. Diffuse osteopenia limits characterization of osseous detail, but no evidence of acute osseous abnormality is appreciated. Changes of vertebroplasty  within the lower thoracic and lumbar spine. IMPRESSION: Moderately distended gas-filled loops of small bowel within the central/LEFT abdomen. Findings could represent a partial small bowel obstruction or ileus. Electronically Signed   By: Franki Cabot M.D.   On: 07/10/2020 12:52   CT Abdomen Pelvis W Contrast  Result Date: 07/10/2020 CLINICAL DATA:  Fall on Monday, low back pain since. Abdominal distension. Bowel obstruction suspected. EXAM: CT ABDOMEN AND PELVIS WITH CONTRAST TECHNIQUE: Multidetector CT imaging of the abdomen and pelvis was performed using the standard protocol following bolus administration of intravenous contrast. CONTRAST:  184mL OMNIPAQUE IOHEXOL 300 MG/ML  SOLN COMPARISON:  CT abdomen dated 01/09/2010. FINDINGS: Lower chest: Bibasilar atelectasis. Hepatobiliary: No focal liver abnormality is seen. Gallbladder is unremarkable. No bile duct dilatation. Pancreas: Unremarkable. No pancreatic ductal dilatation or surrounding inflammatory changes. Spleen: Splenic cysts. Adrenals/Urinary Tract: Adrenal glands are unremarkable. LEFT renal cysts, largest measuring approximately 6 cm greatest dimension. Small nonobstructing renal stones bilaterally. No suspicious mass or hydronephrosis. No perinephric fluid. No evidence of renal injury. No ureteral or bladder calculi are identified. Bladder appears normal, partially decompressed. Stomach/Bowel: Diverticulosis of the sigmoid colon but no focal inflammatory change to suggest acute diverticulitis. Moderate amount of stool and gas throughout the normal-caliber colon. No dilated small bowel loops are seen on this exam. No evidence of bowel wall inflammation. Irregular mass-like thickening within the cecum, measuring 4.6 cm greatest dimension and approximately 2 cm thickness, suspicious for neoplastic growth. This masslike thickening is adjacent to the terminal ileum. Appendix is not convincingly seen but there are no focal inflammatory changes about the  cecum to suggest acute appendicitis. Vascular/Lymphatic: Atherosclerosis of the ectatic abdominal aorta. The no acute appearing vascular abnormality. No enlarged lymph nodes are seen within the abdomen or pelvis. Reproductive: Chronic LEFT adnexal cyst. Presumed hysterectomy. No free fluid or inflammatory change within either adnexal region. Other: No free fluid or abscess collection. No free intraperitoneal air. Musculoskeletal: No acute or suspicious osseous abnormality. No evidence of  acute osseous fracture or dislocation. IMPRESSION: 1. Irregular mass-like thickening within the cecum, measuring 4.6 cm greatest dimension and approximately 2 cm thickness, located within the cecum adjacent to the terminal ileum, highly suspicious for malignancy. Consider further characterization with nonemergent colonoscopy. 2. No evidence of bowel obstruction. Moderate amount of stool and gas throughout the mildly distended colon suggesting constipation or mild colonic ileus. No small bowel dilatation. 3. Colonic diverticulosis without evidence of acute diverticulitis. 4. Bilateral nephrolithiasis. No ureteral or bladder calculi. No hydronephrosis. Aortic Atherosclerosis (ICD10-I70.0). Electronically Signed   By: Franki Cabot M.D.   On: 07/10/2020 13:03     Assessment/Plan Principal Problem:   Abdominal pain Active Problems:   Constipation   Mass of colon   Anemia of chronic disease   Fall   Compression fracture of L1 lumbar vertebra (HCC)   Compression fracture of L2 lumbar vertebra (HCC)     Back pain/compression fractures of L1 and L2 Following a fall Lumbar spine x-ray shows possible compression fractures involving L1 and L2 Awaiting results of CT scan of the lumbar spine Place patient on fall precautions Pain control with Lortab Patient will need IR consult for kyphoplasty if CT scan confirms acute fractures.    Mass of colon/constipation /anemia of chronic disease Patient noted to have a  distended abdomen with a palpable mass in the right lumbar/lower quadrant CT scan of abdomen and pelvis shows  Irregular mass-like thickening within the cecum, measuring 4.6 cm greatest dimension and approximately 2 cm thickness, located within the cecum adjacent to the terminal ileum, highly suspicious for malignancy. Patient has a history of constipation and is noted to be anemic with a hemoglobin of 7.8g/dl on admission compared to 13g/dl, 5 years ago Obtain iron panel GI consult for further evaluation    Hypertension Continue hydrochlorothiazide     DVT prophylaxis: Lovenox  Code Status: DNR Family Communication: Greater than 50% of time was spent discussing patient's condition and plan of care with her daughter Shelia Stewart over the phone.  All questions and concerns have been addressed.  She verbalizes understanding and agrees with the plan.  CODE STATUS was discussed and patient is a DNR Disposition Plan: Back to previous home environment Consults called: Gastroenterology Status: At the time of admission, it appears that the appropriate admission status for this patient is inpatient. This is judged to be reasonable and necessary in order to provide the required intensity of service to ensure the patient's safety given the presenting symptoms, physical exam findings and initial radiographic and laboratory data in the context of their comorbid conditions. Patient requires inpatient status due to high intensity of service, high risk for further deterioration and high frequency of surveillance required.    Collier Bullock MD [facility]     07/10/2020, 3:15 PM

## 2020-07-10 NOTE — Consult Note (Signed)
[facility] SURGICAL CONSULTATION NOTE (initial) - cpt: 53976    HISTORY OF PRESENT ILLNESS (HPI):  87 y.o. female presented to [facility] ED today for evaluation of low back pain and weakness.  She is extremely hard of hearing and at some nearly impossible to communicate anything with her.  She apparently had fallen about a week ago and continued to complain of low back pain with diminished ambulation to follow.  She had become progressively weak over the last 2 days.  This is per the patient's daughter's report.  Surgery is consulted by ED physician Dr. Charna Archer in this context for evaluation and management  of large cecal mass in 87 year old female.  PAST MEDICAL HISTORY (PMH):  Past Medical History:  Diagnosis Date  . Breast cancer ([place])   . Hypertension      PAST SURGICAL HISTORY ([place]):  Past Surgical History:  Procedure Laterality Date  . ABDOMINAL HYSTERECTOMY    . BACK SURGERY    . BREAST SURGERY    . KYPHOPLASTY N/A 02/08/2017   Procedure: [id number];  Surgeon: Hessie Knows, MD;  Location: [facility] ORS;  Service: Orthopedics;  Laterality: N/A;  . MASTECTOMY       MEDICATIONS:  Prior to Admission medications   Medication Sig Start Date End Date Taking? Authorizing Provider  beta carotene w/minerals (OCUVITE) tablet Take 1 tablet daily by mouth.   Yes [provider]  polyethylene glycol powder (GLYCOLAX/MIRALAX) 17 GM/SCOOP powder Take 1 Container by mouth daily as needed. 11/24/19  Yes [provider]  senna-docusate (SENOKOT-S) 8.6-50 MG tablet Take 1 tablet by mouth daily. 11/24/19  Yes [provider]  traMADol (ULTRAM) 50 MG tablet Take 50 mg by mouth 2 (two) times daily as needed. 02/01/20  Yes [provider]  acetaminophen (TYLENOL) 325 MG tablet Take 650 mg every 6 (six) hours as needed by mouth (for pain.).    [provider]  aspirin EC 81 MG tablet Take 81 mg daily by mouth.    [provider]   cholecalciferol (VITAMIN D) 1000 units tablet Take 1,000 Units daily by mouth.    [provider]  hydrochlorothiazide (HYDRODIURIL) 25 MG tablet Take 25 mg daily by mouth.    [provider]  HYDROcodone-acetaminophen (NORCO) 5-325 MG tablet Take 1 tablet every 6 (six) hours as needed by mouth for moderate pain. Patient not taking: Reported on 07/10/2020 02/08/17   Hessie Knows, MD  Magnesium 250 MG TABS Take 250 mg daily by mouth.    [provider]  omeprazole (PRILOSEC) 20 MG capsule Take 20 mg daily before breakfast by mouth.    [provider]  polyvinyl alcohol (LIQUIFILM TEARS) 1.4 % ophthalmic solution Place 1-2 drops 3 (three) times daily as needed into both eyes for dry eyes.    [provider]  pravastatin (PRAVACHOL) 40 MG tablet Take 40 mg at bedtime by mouth. 09/14/15   [provider]  Vitamin D, Ergocalciferol, (DRISDOL) 50000 units CAPS capsule Take 50,000 Units every Monday by mouth.    [provider]     ALLERGIES:  Allergies  Allergen Reactions  . Codeine Anaphylaxis  . Alendronate Other (See Comments)    GI upset.     SOCIAL HISTORY:  Social History   Socioeconomic History  . Marital status: Widowed    Spouse name: Not on file  . Number of children: Not on file  . Years of education: Not on file  . Highest education level: Not on file  Occupational History  . Not on file  Tobacco Use  . Smoking status: Never Smoker  . Smokeless tobacco: Not on file  Substance and Sexual Activity  . Alcohol use: No  . Drug use: No  . Sexual activity: Not on file  Other Topics Concern  . Not on file  Social History Narrative  . Not on file   Social Determinants of Health   Financial Resource Strain: Not on file  Food Insecurity: Not on file  Transportation Needs: Not on file  Physical Activity: Not on file  Stress: Not on file  Social Connections: Not on file  Intimate Partner Violence: Not on file      FAMILY HISTORY:  History reviewed. No pertinent family history.    REVIEW OF SYSTEMS:  Review of Systems  Unable to perform ROS: Other  HENT: Positive for hearing loss.   Near deafness.  VITAL SIGNS:  Temp:  [97.9 F (36.6 C)] 97.9 F (36.6 C) (04/17 1048) Pulse Rate:  [98-114] 113 (04/17 1530) Resp:  [13-20] 16 (04/17 1530) BP: (146-186)/(74-95) 168/84 (04/17 1530) SpO2:  [91 %-97 %] 97 % (04/17 1530) Weight:  [49.9 kg] 49.9 kg (04/17 1048)     Height: 5\' 2"  (157.5 cm) Weight: 49.9 kg BMI (Calculated): 20.11   INTAKE/OUTPUT:  No intake/output data recorded.  PHYSICAL EXAM:  Physical Exam Blood pressure (!) 168/84, pulse (!) 113, temperature 97.9 F (36.6 C), temperature source Oral, resp. rate 16, height 5\' 2"  (1.575 m), weight 49.9 kg, SpO2 97 %. Last Weight  Most recent update: 07/10/2020 10:49 AM   Weight  49.9 kg (110 lb)            CONSTITUTIONAL: Thin and frail, appropriately responsive and aware without distress.   EYES: Sclera non-icteric.   EARS, NOSE, MOUTH AND THROAT:  The oropharynx is clear. Oral mucosa is pink and moist. Hearing is questionable at best.  NECK: Trachea is midline, and there is no jugular venous distension.  LYMPH NODES:  Lymph nodes in the neck are not enlarged. RESPIRATORY:  Lungs are clear, and breath sounds are equal bilaterally. Normal respiratory effort without pathologic use of accessory muscles. CARDIOVASCULAR: Heart is regular in rate and tachycardic. GI: The abdomen is soft, nontender with exception of direct palpation of the right lower quadrant mass, and distended. I did not appreciate hepatosplenomegaly. There were normal bowel sounds. MUSCULOSKELETAL: No gross bony deficits or deformities or contractures.   SKIN: Skin turgor is normal.  No rash, lacerations or ulcerations. NEUROLOGIC:  Motor and sensation appear grossly normal.   PSYCH:  Affect appears appropriate for situation.  Data Reviewed I have personally reviewed  what is currently available of the patient's imaging, recent labs and medical records.    Labs:  CBC Latest Ref Rng & Units 07/10/2020 10/08/2015 11/01/2012  WBC 4.0 - 10.5 K/uL 7.8 6.0 9.2  Hemoglobin 12.0 - 15.0 g/dL 7.8(L) 13.1 12.1  Hematocrit 36.0 - 46.0 % 25.5(L) 38.6 35.3  Platelets 150 - 400 K/uL 348 194 205   CMP Latest Ref Rng & Units 07/10/2020 10/08/2015 11/01/2012  Glucose 70 - 99 mg/dL 112(H) 105(H) 90  BUN 8 - 23 mg/dL 17 22(H) 25(H)  Creatinine 0.44 - 1.00 mg/dL 0.58 0.77 0.93  Sodium 135 - 145 mmol/L 133(L) 139 139  Potassium 3.5 - 5.1 mmol/L 3.8 3.9 3.4(L)  Chloride 98 - 111 mmol/L 101 107 106  CO2 22 - 32 mmol/L 26 26 29   Calcium 8.9 - 10.3 mg/dL 8.4(L)  9.2 9.0  Total Protein 6.5 - 8.1 g/dL 5.8(L) - -  Total Bilirubin 0.3 - 1.2 mg/dL 0.9 - -  Alkaline Phos 38 - 126 U/L 53 - -  AST 15 - 41 U/L 12(L) - -  ALT 0 - 44 U/L 8 - -     Imaging studies:  Radiology review: I have personally reviewed the images below. Last 24 hrs: DG Chest 2 View  Result Date: 07/10/2020 CLINICAL DATA:  Fall, low back pain and weakness. EXAM: CHEST - 2 VIEW COMPARISON:  Chest x-ray dated 10/08/2015. FINDINGS: Heart size and mediastinal contours are stable. Chronic bronchitic changes centrally. No confluent opacity to suggest a developing pneumonia. No pleural effusion or pneumothorax is seen. No acute appearing osseous abnormality. Degenerative spondylosis of the slightly kyphotic thoracic spine. IMPRESSION: No acute findings. No evidence of pneumonia or pulmonary edema. Electronically Signed   By: Franki Cabot M.D.   On: 07/10/2020 12:50   DG Lumbar Spine 2-3 Views  Result Date: 07/10/2020 CLINICAL DATA:  [place] on Monday. LOWER back pain and weakness since fall. EXAM: LUMBAR SPINE - 2-3 VIEW COMPARISON:  02/08/2017 FINDINGS: There is normal alignment of the lumbar spine. Patient has had prior vertebral augmentation at T12 and L4. There are chronic superior endplate fractures of L3 and L5. There is  question of anterior cortical discontinuity involving the L1 and L2 vertebral bodies. Detail limited given presence of significant overlying bowel gas. Nonobstructive bowel gas pattern. There is atherosclerotic calcification of the abdominal aorta. Remote LEFT hip ORIF IMPRESSION: Possible compression fractures of L1 and L2. Consider further evaluation with CT of the lumbar spine. Remote vertebral augmentation at T12 and L4. Electronically Signed   By: Nolon Nations M.D.   On: 07/10/2020 12:46   DG Abdomen 1 View  Result Date: 07/10/2020 CLINICAL DATA:  Abdominal pain, fall on Monday, low back pain and weakness since. Abdominal distension. EXAM: ABDOMEN - 1 VIEW COMPARISON:  Plain film of the abdomen dated 11/22/2009. FINDINGS: Moderately distended gas-filled loops of small bowel within the central/LEFT abdomen. Air is seen within the colon. No evidence of free intraperitoneal air. Degenerative spondylosis of the scoliotic thoracolumbar spine. Diffuse osteopenia limits characterization of osseous detail, but no evidence of acute osseous abnormality is appreciated. Changes of vertebroplasty within the lower thoracic and lumbar spine. IMPRESSION: Moderately distended gas-filled loops of small bowel within the central/LEFT abdomen. Findings could represent a partial small bowel obstruction or ileus. Electronically Signed   By: Franki Cabot M.D.   On: 07/10/2020 12:52   CT Abdomen Pelvis W Contrast  Result Date: 07/10/2020 CLINICAL DATA:  Fall on Monday, low back pain since. Abdominal distension. Bowel obstruction suspected. EXAM: CT ABDOMEN AND PELVIS WITH CONTRAST TECHNIQUE: Multidetector CT imaging of the abdomen and pelvis was performed using the standard protocol following bolus administration of intravenous contrast. CONTRAST:  124mL OMNIPAQUE IOHEXOL 300 MG/ML  SOLN COMPARISON:  CT abdomen dated 01/09/2010. FINDINGS: Lower chest: Bibasilar atelectasis. Hepatobiliary: No focal liver abnormality is  seen. Gallbladder is unremarkable. No bile duct dilatation. Pancreas: Unremarkable. No pancreatic ductal dilatation or surrounding inflammatory changes. Spleen: Splenic cysts. Adrenals/Urinary Tract: Adrenal glands are unremarkable. LEFT renal cysts, largest measuring approximately 6 cm greatest dimension. Small nonobstructing renal stones bilaterally. No suspicious mass or hydronephrosis. No perinephric fluid. No evidence of renal injury. No ureteral or bladder calculi are identified. Bladder appears normal, partially decompressed. Stomach/Bowel: Diverticulosis of the sigmoid colon but no focal inflammatory change to suggest acute diverticulitis. Moderate amount  of stool and gas throughout the normal-caliber colon. No dilated small bowel loops are seen on this exam. No evidence of bowel wall inflammation. Irregular mass-like thickening within the cecum, measuring 4.6 cm greatest dimension and approximately 2 cm thickness, suspicious for neoplastic growth. This masslike thickening is adjacent to the terminal ileum. Appendix is not convincingly seen but there are no focal inflammatory changes about the cecum to suggest acute appendicitis. Vascular/Lymphatic: Atherosclerosis of the ectatic abdominal aorta. The no acute appearing vascular abnormality. No enlarged lymph nodes are seen within the abdomen or pelvis. Reproductive: Chronic LEFT adnexal cyst. Presumed hysterectomy. No free fluid or inflammatory change within either adnexal region. Other: No free fluid or abscess collection. No free intraperitoneal air. Musculoskeletal: No acute or suspicious osseous abnormality. No evidence of acute osseous fracture or dislocation. IMPRESSION: 1. Irregular mass-like thickening within the cecum, measuring 4.6 cm greatest dimension and approximately 2 cm thickness, located within the cecum adjacent to the terminal ileum, highly suspicious for malignancy. Consider further characterization with nonemergent colonoscopy. 2. No  evidence of bowel obstruction. Moderate amount of stool and gas throughout the mildly distended colon suggesting constipation or mild colonic ileus. No small bowel dilatation. 3. Colonic diverticulosis without evidence of acute diverticulitis. 4. Bilateral nephrolithiasis. No ureteral or bladder calculi. No hydronephrosis. Aortic Atherosclerosis (ICD10-I70.0). Electronically Signed   By: Franki Cabot M.D.   On: 07/10/2020 13:03   CT L-SPINE NO CHARGE  Result Date: 07/10/2020 CLINICAL DATA:  Compression fracture, L spine. EXAM: CT LUMBAR SPINE WITHOUT CONTRAST TECHNIQUE: Multidetector CT imaging of the lumbar spine was performed without intravenous contrast administration. Multiplanar CT image reconstructions were also generated. COMPARISON:  CT abdomen/pelvis 07/10/2020. Lumbar spine radiographs 07/10/2020. FINDINGS: Segmentation: 5 lumbar vertebrae. The caudal most well-formed intervertebral disc space is designated L5-S1 Alignment: Lumbar levocurvature.  Mild T12-L1 grade 1 retrolisthesis Vertebrae: Redemonstrated chronic T12, L3, L4 and L5 vertebral compression fracture deformities. Sequela of prior vertebral augmentation at T12 and L4. No L1 or L2 compression fracture is identified (as was questioned on the lumbar spine radiographs performed earlier today). No evidence of acute fracture to the lumbar spine. Paraspinal and other soft tissues: Please refer to same day CT abdomen/pelvis for description of abdominopelvic soft tissue findings. Paraspinal soft tissues within normal limits. Disc levels: Moderate disc space narrowing within the visualized lower thoracic spine and at T12-L1. Moderate disc space narrowing at L5-S1. Otherwise, there is no more than mild disc space narrowing within the lumbar spine. T12-L1: Mild bony retropulsion at the level of the T12 inferior endplate. Associated disc bulge. Mild relative spinal canal narrowing. Mild bilateral bony neural foraminal narrowing. L1-L2: Disc bulge. No  appreciable significant spinal canal stenosis. Mild bilateral neural foraminal narrowing. L2-L3: Disc bulge. Facet arthrosis. Suspected moderate spinal canal stenosis. Bilateral neural foraminal narrowing (moderate right, mild left). L3-L4: Disc bulge. Facet arthrosis and ligamentum flavum hypertrophy. Suspected at least moderate spinal canal stenosis. Moderate bilateral neural foraminal narrowing. L4-L5: Disc bulge. Moderate facet arthrosis with ligamentum flavum hypertrophy. Suspected severe spinal canal stenosis. Moderate bilateral neural foraminal narrowing. L5-S1: Disc bulge with endplate spurring and superimposed partially calcified small central disc protrusion. Facet arthrosis with ligamentum flavum hypertrophy. Suspected mild/moderate central canal stenosis with bilateral subarticular narrowing. Moderate left neural foraminal narrowing. IMPRESSION: No evidence of acute fracture to the lumbar spine. Chronic vertebral compression fractures at T12, L3, L4 and L5. Prior vertebral augmentation at T12 and L4. Lumbar spondylosis as detailed. Spinal canal stenosis is greatest at L4-L5, where there is  suspected multifactorial severe spinal canal stenosis. Sites of mild and moderate neural foraminal narrowing, as detailed. Lumbar levocurvature. Electronically Signed   By: Kellie Simmering DO   On: 07/10/2020 15:37     Assessment/Plan:  87 y.o. female with large cecal mass with associated anemia, compression fractures as noted above, complicated by pertinent comorbidities including:  Patient Active Problem List   Diagnosis Date Noted  . Abdominal pain 07/10/2020  . Constipation 07/10/2020  . Mass of colon 07/10/2020  . Anemia of chronic disease 07/10/2020  . Fall 07/10/2020  . Compression fracture of L1 lumbar vertebra ([place]) 07/10/2020  . Compression fracture of L2 lumbar vertebra ([place]) 07/10/2020  . Back pain, acute 07/10/2020  . Essential hypertension 07/10/2020    -Feel she is best admitted to the  hospitalist for GI evaluation.  -As there is no evidence of obstruction, perforation or peritonitis, I see no need for urgent surgery.  -After definitive diagnoses are made, will discuss with family regarding degrees of aggressiveness in her management/treatment.  - DVT prophylaxis  We will follow along with you.  Thank you for the opportunity to participate in this patient's care.   -- Ronny Bacon, M.D., FACS 07/10/2020, 3:44 PM

## 2020-07-10 NOTE — ED Notes (Signed)
Informed Rn bed assigned  

## 2020-07-10 NOTE — ED Triage Notes (Signed)
Pt via EMS from home, pt had a fall on Monday has been c/o lower back pain since then. Pt has been more weak since Wednesday and been in her recliner since yesterday. On arrival, pt is HOH. Abdomen is distended. Pt is A&Ox4 and NAD.

## 2020-07-10 NOTE — Progress Notes (Signed)
PHARMACIST - PHYSICIAN COMMUNICATION  CONCERNING:  Enoxaparin (Lovenox) for DVT Prophylaxis    RECOMMENDATION: Patient was prescribed enoxaprin 40mg  q24 hours for VTE prophylaxis.   Filed Weights   07/10/20 1048  Weight: 49.9 kg (110 lb)    Body mass index is 20.12 kg/m.  Estimated Creatinine Clearance: 34.6 mL/min (by C-G formula based on SCr of 0.58 mg/dL).   Patient is candidate for enoxaparin 30mg  every 24 hours based on CrCl <9ml/min or Weight <45kg  DESCRIPTION: Pharmacy has adjusted enoxaparin dose per [facility] policy.  Patient is now receiving enoxaparin 30 mg every 24 hours    Berta Minor, PharmD Clinical Pharmacist  07/10/2020 5:51 PM

## 2020-07-10 NOTE — Plan of Care (Signed)

## 2020-07-10 NOTE — ED Notes (Signed)
Pt transported to X-Ray and CT scan at this time.

## 2020-07-11 DIAGNOSIS — D509 Iron deficiency anemia, unspecified: Secondary | ICD-10-CM

## 2020-07-11 DIAGNOSIS — F0390 Unspecified dementia without behavioral disturbance: Secondary | ICD-10-CM

## 2020-07-11 DIAGNOSIS — K6389 Other specified diseases of intestine: Secondary | ICD-10-CM | POA: Diagnosis not present

## 2020-07-11 DIAGNOSIS — I10 Essential (primary) hypertension: Secondary | ICD-10-CM

## 2020-07-11 DIAGNOSIS — Z853 Personal history of malignant neoplasm of breast: Secondary | ICD-10-CM

## 2020-07-11 LAB — COMPREHENSIVE METABOLIC PANEL
ALT: 8 U/L (ref 0–44)
AST: 13 U/L — ABNORMAL LOW (ref 15–41)
Albumin: 2.5 g/dL — ABNORMAL LOW (ref 3.5–5.0)
Alkaline Phosphatase: 55 U/L (ref 38–126)
Anion gap: 9 (ref 5–15)
BUN: 11 mg/dL (ref 8–23)
CO2: 27 mmol/L (ref 22–32)
Calcium: 8.6 mg/dL — ABNORMAL LOW (ref 8.9–10.3)
Chloride: 98 mmol/L (ref 98–111)
Creatinine, Ser: 0.47 mg/dL (ref 0.44–1.00)
GFR, Estimated: >60 mL/min (ref >60)
Glucose, Bld: 131 mg/dL — ABNORMAL HIGH (ref 70–99)
Potassium: 3.8 mmol/L (ref 3.5–5.1)
Sodium: 134 mmol/L — ABNORMAL LOW (ref 135–145)
Total Bilirubin: 1 mg/dL (ref 0.3–1.2)
Total Protein: 5.2 g/dL — ABNORMAL LOW (ref 6.5–8.1)

## 2020-07-11 MED ORDER — ENOXAPARIN SODIUM 40 MG/0.4ML ~~LOC~~ SOLN
40.0000 mg | SUBCUTANEOUS | Status: DC
  Administered 2020-07-11 – 2020-07-12 (×2): 40 mg via SUBCUTANEOUS
  Filled 2020-07-11 (×2): qty 0.4

## 2020-07-11 MED ORDER — FLEET ENEMA 7-19 GM/118ML RE ENEM: 1.0000 | ENEMA | Freq: Every day | RECTAL | Status: DC | PRN

## 2020-07-11 MED ORDER — LIDOCAINE 5 % EX PTCH
1.0000 | MEDICATED_PATCH | CUTANEOUS | Status: DC
  Administered 2020-07-11 – 2020-07-13 (×3): 1 via TRANSDERMAL
  Filled 2020-07-11 (×3): qty 1

## 2020-07-11 MED ORDER — SODIUM CHLORIDE 0.9 % IV SOLN
510.0000 mg | Freq: Once | INTRAVENOUS | Status: AC
  Administered 2020-07-11: 510 mg via INTRAVENOUS
  Filled 2020-07-11: qty 17

## 2020-07-11 MED ORDER — POLYETHYLENE GLYCOL 3350 17 G PO PACK
17.0000 g | PACK | Freq: Once | ORAL | Status: AC
  Administered 2020-07-11: 17 g via ORAL
  Filled 2020-07-11: qty 1

## 2020-07-11 NOTE — Consult Note (Signed)
Neurosurgery-New Consultation Evaluation 07/11/2020 Shelia Stewart [id number]  Identifying Statement: Shelia Stewart is a 87 y.o. female from [place] [id number] with back pain  Physician Requesting Consultation: Dr Royce Macadamia Agbata   History of Present Illness: Shelia Stewart is admitted to the hospital with low back pain and difficulty ambulating.  She has had a history of multiple chronic compression fractures including treated with kyphoplasty in 2018.  She is hard of hearing and not cooperating with history or nursing today. She is moving her legs spontaneously.   CT Abdomen and pelvis was obtained  given concern for possible abdominal pathology due to anemia and CT of the lumbar spine did reveal chronic compression fractures.    Past Medical History:  Past Medical History:  Diagnosis Date  . Breast cancer ([place])   . Hypertension     Social History: Social History   Socioeconomic History  . Marital status: Widowed    Spouse name: Not on file  . Number of children: Not on file  . Years of education: Not on file  . Highest education level: Not on file  Occupational History  . Not on file  Tobacco Use  . Smoking status: Never Smoker  . Smokeless tobacco: Not on file  Substance and Sexual Activity  . Alcohol use: No  . Drug use: No  . Sexual activity: Not on file  Other Topics Concern  . Not on file  Social History Narrative  . Not on file   Social Determinants of Health   Financial Resource Strain: Not on file  Food Insecurity: Not on file  Transportation Needs: Not on file  Physical Activity: Not on file  Stress: Not on file  Social Connections: Not on file  Intimate Partner Violence: Not on file    Family History: History reviewed. No pertinent family history.  Review of Systems:  Review of Systems - General ROS: Unable to obtain remainder due to patients cognition Musculoskeletal ROS: Positive for back pain  Physical Exam: BP (!) 142/80    Pulse 100   Temp 98.1 F (36.7 C) (Oral)   Resp 16   Ht 5\' 2"  (1.575 m)   Wt 49.9 kg   SpO2 94%   BMI 20.12 kg/m  Body mass index is 20.12 kg/m. Body surface area is 1.48 meters squared. General appearance: Alert, cooperative, in no acute distress Head: Normocephalic, atraumatic Eyes: Normal, EOM intact Oropharynx: Moist without lesions Ext: No edema in LE bilaterally  Neurologic exam:  Mental status: alertness: alert, affect: normal Speech: fluent  Motor:antigravity in lower extremities but will not participate in strength exam Sensory: intact to light touch in lower extremities Gait: not tested   Laboratory: Results for orders placed or performed during the hospital encounter of 07/10/20  Resp Panel by RT-PCR (Flu A&B, Covid) Nasopharyngeal Swab   Specimen: Nasopharyngeal Swab; Nasopharyngeal(NP) swabs in vial transport medium  Result Value Ref Range   SARS Coronavirus 2 by RT PCR NEGATIVE NEGATIVE   Influenza A by PCR NEGATIVE NEGATIVE   Influenza B by PCR NEGATIVE NEGATIVE  CBC with Differential  Result Value Ref Range   WBC 7.8 4.0 - 10.5 K/uL   RBC 3.53 (L) 3.87 - 5.11 MIL/uL   Hemoglobin 7.8 (L) 12.0 - 15.0 g/dL   HCT 25.5 (L) 36.0 - 46.0 %   MCV 72.2 (L) 80.0 - 100.0 fL   MCH 22.1 (L) 26.0 - 34.0 pg   MCHC 30.6 30.0 - 36.0 g/dL   RDW 18.7 (  H) 11.5 - 15.5 %   Platelets 348 150 - 400 K/uL   nRBC 0.0 0.0 - 0.2 %   Neutrophils Relative % 77 %   Neutro Abs 6.0 1.7 - 7.7 K/uL   Lymphocytes Relative 12 %   Lymphs Abs 0.9 0.7 - 4.0 K/uL   Monocytes Relative 8 %   Monocytes Absolute 0.6 0.1 - 1.0 K/uL   Eosinophils Relative 2 %   Eosinophils Absolute 0.2 0.0 - 0.5 K/uL   Basophils Relative 1 %   Basophils Absolute 0.0 0.0 - 0.1 K/uL   Immature Granulocytes 0 %   Abs Immature Granulocytes 0.03 0.00 - 0.07 K/uL  Comprehensive metabolic panel  Result Value Ref Range   Sodium 133 (L) 135 - 145 mmol/L   Potassium 3.8 3.5 - 5.1 mmol/L   Chloride 101 98 - 111  mmol/L   CO2 26 22 - 32 mmol/L   Glucose, Bld 112 (H) 70 - 99 mg/dL   BUN 17 8 - 23 mg/dL   Creatinine, Ser 0.58 0.44 - 1.00 mg/dL   Calcium 8.4 (L) 8.9 - 10.3 mg/dL   Total Protein 5.8 (L) 6.5 - 8.1 g/dL   Albumin 2.9 (L) 3.5 - 5.0 g/dL   AST 12 (L) 15 - 41 U/L   ALT 8 0 - 44 U/L   Alkaline Phosphatase 53 38 - 126 U/L   Total Bilirubin 0.9 0.3 - 1.2 mg/dL   GFR, Estimated >60 >60 mL/min   Anion gap 6 5 - 15  Lipase, blood  Result Value Ref Range   Lipase 27 11 - 51 U/L  Urinalysis, Complete w Microscopic Urine, Clean Catch  Result Value Ref Range   Color, Urine YELLOW (A) YELLOW   APPearance CLEAR (A) CLEAR   Specific Gravity, Urine 1.021 1.005 - 1.030   pH 5.0 5.0 - 8.0   Glucose, UA NEGATIVE NEGATIVE mg/dL   Hgb urine dipstick NEGATIVE NEGATIVE   Bilirubin Urine NEGATIVE NEGATIVE   Ketones, ur NEGATIVE NEGATIVE mg/dL   Protein, ur NEGATIVE NEGATIVE mg/dL   Nitrite NEGATIVE NEGATIVE   Leukocytes,Ua NEGATIVE NEGATIVE   RBC / HPF 0-5 0 - 5 RBC/hpf   WBC, UA 0-5 0 - 5 WBC/hpf   Bacteria, UA RARE (A) NONE SEEN   Squamous Epithelial / LPF 0-5 0 - 5  Iron and TIBC  Result Value Ref Range   Iron 12 (L) 28 - 170 ug/dL   TIBC 322 250 - 450 ug/dL   Saturation Ratios 4 (L) 10.4 - 31.8 %   UIBC 310 ug/dL  Ferritin  Result Value Ref Range   Ferritin 21 11 - 307 ng/mL  Comprehensive metabolic panel  Result Value Ref Range   Sodium 134 (L) 135 - 145 mmol/L   Potassium 3.8 3.5 - 5.1 mmol/L   Chloride 98 98 - 111 mmol/L   CO2 27 22 - 32 mmol/L   Glucose, Bld 131 (H) 70 - 99 mg/dL   BUN 11 8 - 23 mg/dL   Creatinine, Ser 0.47 0.44 - 1.00 mg/dL   Calcium 8.6 (L) 8.9 - 10.3 mg/dL   Total Protein 5.2 (L) 6.5 - 8.1 g/dL   Albumin 2.5 (L) 3.5 - 5.0 g/dL   AST 13 (L) 15 - 41 U/L   ALT 8 0 - 44 U/L   Alkaline Phosphatase 55 38 - 126 U/L   Total Bilirubin 1.0 0.3 - 1.2 mg/dL   GFR, Estimated >60 >60 mL/min   Anion gap 9  5 - 15  Troponin I (High Sensitivity)  Result Value Ref  Range   Troponin I (High Sensitivity) 6 <18 ng/L   I personally reviewed labs  Imaging: CT Lumbar Spine: No evidence of acute fracture to the lumbar spine.  Chronic vertebral compression fractures at T12, L3, L4 and L5. Prior vertebral augmentation at T12 and L4. Lumbar spondylosis as detailed. Spinal canal stenosis is greatest at L4-L5, where there is suspected multifactorial severe spinal canal stenosis. Sites of mild and moderate neural foraminal narrowing, as detailed.   Impression/Plan:  Shelia Stewart is here with back pain after a fall. It is difficult to get an exam or history but CT doesn't show any acute fracture. Patient can have a LSO and work with PT. Will need to rely on medical management of her pain. I do not feel she is a surgical candidate and no surgery is recommended. No follow up needed given absence of acute fracture.    1.  Diagnosis: Back pain   2.  Plan - No surgical intervention  - Can consider LSO, PT, and pain control   Deetta Perla, MD

## 2020-07-11 NOTE — Progress Notes (Signed)
[facility] SURGICAL PROGRESS NOTE (cpt [phone])  Hospital Day(s): 1.   Interval History:  Patient with history of dementia; unable to reliably participate  Patient seen and examined, no acute events or new complaints overnight. Hgb 7.8 on admission; doesn't appear to have repeat CBC this morning. CEA pending as well. She is currently on full liquid diet; which he seemed to be tolerating well. GI consultation pending.    Review of Systems:  ROS limited secondary to dementia    Vital signs in last 24 hours: [min-max] current  Temp:  [97.4 F (36.3 C)-98 F (36.7 C)] 97.9 F (36.6 C) (04/18 0440) Pulse Rate:  [94-114] 107 (04/18 0440) Resp:  [13-20] 19 (04/18 0440) BP: (141-186)/(70-95) 147/86 (04/18 0440) SpO2:  [91 %-97 %] 94 % (04/18 0440) Weight:  [49.9 kg] 49.9 kg (04/17 1048)     Height: 5\' 2"  (157.5 cm) Weight: 49.9 kg BMI (Calculated): 20.11   Intake/Output last 2 shifts:  04/17 0701 - 04/18 0700 In: 40 [P.O.:40] Out: 600 [Urine:600]   Physical Exam:  Constitutional: alert, cooperative and no distress; altered at baseline HENT: normocephalic without obvious abnormality  Eyes: PERRL, EOM's grossly intact and symmetric  Respiratory: breathing non-labored at rest  Cardiovascular: regular rate and sinus rhythm  Gastrointestinal:Abdomen is distended, soft, she did seem to be tender in the RLQ initially but I could not illicit pain when distracted, no evidence of peritonitis  Musculoskeletal: no edema or wounds, motor and sensation grossly intact, NT    Labs:  CBC Latest Ref Rng & Units 07/10/2020 10/08/2015 11/01/2012  WBC 4.0 - 10.5 K/uL 7.8 6.0 9.2  Hemoglobin 12.0 - 15.0 g/dL 7.8(L) 13.1 12.1  Hematocrit 36.0 - 46.0 % 25.5(L) 38.6 35.3  Platelets 150 - 400 K/uL 348 194 205   CMP Latest Ref Rng & Units 07/11/2020 07/10/2020 10/08/2015  Glucose 70 - 99 mg/dL 131(H) 112(H) 105(H)  BUN 8 - 23 mg/dL 11 17 22(H)  Creatinine 0.44 - 1.00 mg/dL 0.47 0.58 0.77   Sodium 135 - 145 mmol/L 134(L) 133(L) 139  Potassium 3.5 - 5.1 mmol/L 3.8 3.8 3.9  Chloride 98 - 111 mmol/L 98 101 107  CO2 22 - 32 mmol/L 27 26 26   Calcium 8.9 - 10.3 mg/dL 8.6(L) 8.4(L) 9.2  Total Protein 6.5 - 8.1 g/dL 5.2(L) 5.8(L) -  Total Bilirubin 0.3 - 1.2 mg/dL 1.0 0.9 -  Alkaline Phos 38 - 126 U/L 55 53 -  AST 15 - 41 U/L 13(L) 12(L) -  ALT 0 - 44 U/L 8 8 -      Imaging studies: No new pertinent imaging studies   Assessment/Plan: (ICD-10's: L43.89) 87 y.o. female with anemia secondary to possible malignant appearing cecal mass without obstruction or perforation, complicated by pertinent comorbidities including advanced age, history of breast CA.   - Await GI consultation and recommendations; appreciate their assistance  - No emergent surgical indications; pending definitive diagnosis will need to discuss [place] with patient/family; involving palliative care may be reasonable early on in this case. I do not suspect she will be an optimal surgical candidate.   - Okay for diet from surgical standpoint   - Monitor H&H; transfuse as needed  - Monitor abdominal examination; on-going bowel function  - Pain control prn; antiemetics prn   - Further management per primary service; we will follow along   All of the above findings and recommendations were discussed with the medical team.   -- Edison Simon, PA-C [facility]  07/11/2020, 7:24 AM [phone] M-F: 7am - 4pm

## 2020-07-11 NOTE — Plan of Care (Signed)

## 2020-07-11 NOTE — Consult Note (Addendum)
Shelia Stewart , MD [address], [place], [place], [place], [address] [address] [place], [place], [place], [address] Phone: [phone]  Fax: [phone]  Consultation  Referring Provider:     Dr Kurtis Bushman Primary Care Physician:  Tracie Harrier, MD Primary Gastroenterologist:  None          Reason for Consultation:     Cecal mass   Date of Admission:  07/10/2020 Date of Consultation:  07/11/2020         HPI:   Shelia Stewart is a 87 y.o. female Presented to the emergency room with lower back pain and weakness.  In the ER was noted to have a distended abdomen and a palpable mass in the right lower quadrant which led to a CT scan of the abdomen that showed an irregular masslike thickening within the cecum measuring 4.6 cm with 2 cm thickness within the cecum adjacent to the terminal ileum highly suspicious for malignancy.  No evidence of bowel obstruction.  Moderate amount of stool and gas throughout the mildly distended colon suggesting constipation or mild colonic ileus.  No small bowel dilation.  The patient appears confused when I went into the room to speak to her.  Unable to give a history.  No family members at bedside.  Appears comfortable.  Past Medical History:  Diagnosis Date  . Breast cancer ([place])   . Hypertension     Past Surgical History:  Procedure Laterality Date  . ABDOMINAL HYSTERECTOMY    . BACK SURGERY    . BREAST SURGERY    . KYPHOPLASTY N/A 02/08/2017   Procedure: [id number];  Surgeon: Hessie Knows, MD;  Location: [facility] ORS;  Service: Orthopedics;  Laterality: N/A;  . MASTECTOMY      Prior to Admission medications   Medication Sig Start Date End Date Taking? Authorizing Provider  beta carotene w/minerals (OCUVITE) tablet Take 1 tablet daily by mouth.   Yes [provider]  polyethylene glycol powder (GLYCOLAX/MIRALAX) 17 GM/SCOOP powder Take 1 Container by mouth daily as needed. 11/24/19  Yes [provider]   senna-docusate (SENOKOT-S) 8.6-50 MG tablet Take 1 tablet by mouth daily. 11/24/19  Yes [provider]  traMADol (ULTRAM) 50 MG tablet Take 50 mg by mouth 2 (two) times daily as needed. 02/01/20  Yes [provider]  acetaminophen (TYLENOL) 325 MG tablet Take 650 mg every 6 (six) hours as needed by mouth (for pain.).    [provider]  aspirin EC 81 MG tablet Take 81 mg daily by mouth.    [provider]  cholecalciferol (VITAMIN D) 1000 units tablet Take 1,000 Units daily by mouth.    [provider]  hydrochlorothiazide (HYDRODIURIL) 25 MG tablet Take 25 mg daily by mouth.    [provider]  HYDROcodone-acetaminophen (NORCO) 5-325 MG tablet Take 1 tablet every 6 (six) hours as needed by mouth for moderate pain. Patient not taking: Reported on 07/10/2020 02/08/17   Hessie Knows, MD  Magnesium 250 MG TABS Take 250 mg daily by mouth.    [provider]  omeprazole (PRILOSEC) 20 MG capsule Take 20 mg daily before breakfast by mouth.    [provider]  polyvinyl alcohol (LIQUIFILM TEARS) 1.4 % ophthalmic solution Place 1-2 drops 3 (three) times daily as needed into both eyes for dry eyes.    [provider]  pravastatin (PRAVACHOL) 40 MG tablet Take 40 mg at bedtime by mouth. 09/14/15   [provider]  Vitamin D,  Ergocalciferol, (DRISDOL) 50000 units CAPS capsule Take 50,000 Units every Monday by mouth.    [provider]    History reviewed. No pertinent family history.   Social History   Tobacco Use  . Smoking status: Never Smoker  Substance Use Topics  . Alcohol use: No  . Drug use: No    Allergies as of 07/10/2020 - Review Complete 07/10/2020  Allergen Reaction Noted  . Codeine Anaphylaxis 10/08/2015  . Alendronate Other (See Comments) 09/30/2013    Review of Systems:    Unable to obtain ROS due to patient's baseline confusion   Physical Exam:  Vital signs in last 24  hours: Temp:  [97.4 F (36.3 C)-98 F (36.7 C)] 97.9 F (36.6 C) (04/18 0440) Pulse Rate:  [94-114] 107 (04/18 0440) Resp:  [13-20] 19 (04/18 0440) BP: (141-186)/(70-95) 147/86 (04/18 0440) SpO2:  [91 %-97 %] 94 % (04/18 0440) Weight:  [49.9 kg] 49.9 kg (04/17 1048)   General:   Pleasant, c appears comfortable not in any pain or distress Head:  Normocephalic and atraumatic. Eyes:   No icterus.   Conjunctiva pink. PERRLA. Ears:  Normal auditory acuity. Neck:  Supple; no masses or thyroidomegaly Lungs: Respirations even and unlabored. Lungs clear to auscultation bilaterally.   No wheezes, crackles, or rhonchi.  Heart:  Regular rate and rhythm;  Without murmur, clicks, rubs or gallops Abdomen: Mild distention, mild right lower quadrant tenderness but no guarding or rigidity.  Bowel sounds are present. Neurologic:  Alert and oriented x0; moving all 4 limbs. Psych:  Alert but appears confused  LAB RESULTS: Recent Labs    07/10/20 1055  WBC 7.8  HGB 7.8*  HCT 25.5*  PLT 348   BMET Recent Labs    07/10/20 1055 07/11/20 0307  NA 133* 134*  K 3.8 3.8  CL 101 98  CO2 26 27  GLUCOSE 112* 131*  BUN 17 11  CREATININE 0.58 0.47  CALCIUM 8.4* 8.6*   LFT Recent Labs    07/11/20 0307  PROT 5.2*  ALBUMIN 2.5*  AST 13*  ALT 8  ALKPHOS 55  BILITOT 1.0   PT/INR No results for input(s): LABPROT, INR in the last 72 hours.  STUDIES: DG Chest 2 View  Result Date: 07/10/2020 CLINICAL DATA:  Fall, low back pain and weakness. EXAM: CHEST - 2 VIEW COMPARISON:  Chest x-ray dated 10/08/2015. FINDINGS: Heart size and mediastinal contours are stable. Chronic bronchitic changes centrally. No confluent opacity to suggest a developing pneumonia. No pleural effusion or pneumothorax is seen. No acute appearing osseous abnormality. Degenerative spondylosis of the slightly kyphotic thoracic spine. IMPRESSION: No acute findings. No evidence of pneumonia or pulmonary edema. Electronically Signed    By: Franki Cabot M.D.   On: 07/10/2020 12:50   DG Lumbar Spine 2-3 Views  Result Date: 07/10/2020 CLINICAL DATA:  [place] on Monday. LOWER back pain and weakness since fall. EXAM: LUMBAR SPINE - 2-3 VIEW COMPARISON:  02/08/2017 FINDINGS: There is normal alignment of the lumbar spine. Patient has had prior vertebral augmentation at T12 and L4. There are chronic superior endplate fractures of L3 and L5. There is question of anterior cortical discontinuity involving the L1 and L2 vertebral bodies. Detail limited given presence of significant overlying bowel gas. Nonobstructive bowel gas pattern. There is atherosclerotic calcification of the abdominal aorta. Remote LEFT hip ORIF IMPRESSION: Possible compression fractures of L1 and L2. Consider further evaluation with CT of the lumbar spine. Remote vertebral augmentation at T12 and L4. Electronically  Signed   By: Nolon Nations M.D.   On: 07/10/2020 12:46   DG Abdomen 1 View  Result Date: 07/10/2020 CLINICAL DATA:  Abdominal pain, fall on Monday, low back pain and weakness since. Abdominal distension. EXAM: ABDOMEN - 1 VIEW COMPARISON:  Plain film of the abdomen dated 11/22/2009. FINDINGS: Moderately distended gas-filled loops of small bowel within the central/LEFT abdomen. Air is seen within the colon. No evidence of free intraperitoneal air. Degenerative spondylosis of the scoliotic thoracolumbar spine. Diffuse osteopenia limits characterization of osseous detail, but no evidence of acute osseous abnormality is appreciated. Changes of vertebroplasty within the lower thoracic and lumbar spine. IMPRESSION: Moderately distended gas-filled loops of small bowel within the central/LEFT abdomen. Findings could represent a partial small bowel obstruction or ileus. Electronically Signed   By: Franki Cabot M.D.   On: 07/10/2020 12:52   CT Abdomen Pelvis W Contrast  Result Date: 07/10/2020 CLINICAL DATA:  Fall on Monday, low back pain since. Abdominal distension.  Bowel obstruction suspected. EXAM: CT ABDOMEN AND PELVIS WITH CONTRAST TECHNIQUE: Multidetector CT imaging of the abdomen and pelvis was performed using the standard protocol following bolus administration of intravenous contrast. CONTRAST:  168mL OMNIPAQUE IOHEXOL 300 MG/ML  SOLN COMPARISON:  CT abdomen dated 01/09/2010. FINDINGS: Lower chest: Bibasilar atelectasis. Hepatobiliary: No focal liver abnormality is seen. Gallbladder is unremarkable. No bile duct dilatation. Pancreas: Unremarkable. No pancreatic ductal dilatation or surrounding inflammatory changes. Spleen: Splenic cysts. Adrenals/Urinary Tract: Adrenal glands are unremarkable. LEFT renal cysts, largest measuring approximately 6 cm greatest dimension. Small nonobstructing renal stones bilaterally. No suspicious mass or hydronephrosis. No perinephric fluid. No evidence of renal injury. No ureteral or bladder calculi are identified. Bladder appears normal, partially decompressed. Stomach/Bowel: Diverticulosis of the sigmoid colon but no focal inflammatory change to suggest acute diverticulitis. Moderate amount of stool and gas throughout the normal-caliber colon. No dilated small bowel loops are seen on this exam. No evidence of bowel wall inflammation. Irregular mass-like thickening within the cecum, measuring 4.6 cm greatest dimension and approximately 2 cm thickness, suspicious for neoplastic growth. This masslike thickening is adjacent to the terminal ileum. Appendix is not convincingly seen but there are no focal inflammatory changes about the cecum to suggest acute appendicitis. Vascular/Lymphatic: Atherosclerosis of the ectatic abdominal aorta. The no acute appearing vascular abnormality. No enlarged lymph nodes are seen within the abdomen or pelvis. Reproductive: Chronic LEFT adnexal cyst. Presumed hysterectomy. No free fluid or inflammatory change within either adnexal region. Other: No free fluid or abscess collection. No free intraperitoneal  air. Musculoskeletal: No acute or suspicious osseous abnormality. No evidence of acute osseous fracture or dislocation. IMPRESSION: 1. Irregular mass-like thickening within the cecum, measuring 4.6 cm greatest dimension and approximately 2 cm thickness, located within the cecum adjacent to the terminal ileum, highly suspicious for malignancy. Consider further characterization with nonemergent colonoscopy. 2. No evidence of bowel obstruction. Moderate amount of stool and gas throughout the mildly distended colon suggesting constipation or mild colonic ileus. No small bowel dilatation. 3. Colonic diverticulosis without evidence of acute diverticulitis. 4. Bilateral nephrolithiasis. No ureteral or bladder calculi. No hydronephrosis. Aortic Atherosclerosis (ICD10-I70.0). Electronically Signed   By: Franki Cabot M.D.   On: 07/10/2020 13:03   CT L-SPINE NO CHARGE  Result Date: 07/10/2020 CLINICAL DATA:  Compression fracture, L spine. EXAM: CT LUMBAR SPINE WITHOUT CONTRAST TECHNIQUE: Multidetector CT imaging of the lumbar spine was performed without intravenous contrast administration. Multiplanar CT image reconstructions were also generated. COMPARISON:  CT abdomen/pelvis 07/10/2020. Lumbar  spine radiographs 07/10/2020. FINDINGS: Segmentation: 5 lumbar vertebrae. The caudal most well-formed intervertebral disc space is designated L5-S1 Alignment: Lumbar levocurvature.  Mild T12-L1 grade 1 retrolisthesis Vertebrae: Redemonstrated chronic T12, L3, L4 and L5 vertebral compression fracture deformities. Sequela of prior vertebral augmentation at T12 and L4. No L1 or L2 compression fracture is identified (as was questioned on the lumbar spine radiographs performed earlier today). No evidence of acute fracture to the lumbar spine. Paraspinal and other soft tissues: Please refer to same day CT abdomen/pelvis for description of abdominopelvic soft tissue findings. Paraspinal soft tissues within normal limits. Disc levels:  Moderate disc space narrowing within the visualized lower thoracic spine and at T12-L1. Moderate disc space narrowing at L5-S1. Otherwise, there is no more than mild disc space narrowing within the lumbar spine. T12-L1: Mild bony retropulsion at the level of the T12 inferior endplate. Associated disc bulge. Mild relative spinal canal narrowing. Mild bilateral bony neural foraminal narrowing. L1-L2: Disc bulge. No appreciable significant spinal canal stenosis. Mild bilateral neural foraminal narrowing. L2-L3: Disc bulge. Facet arthrosis. Suspected moderate spinal canal stenosis. Bilateral neural foraminal narrowing (moderate right, mild left). L3-L4: Disc bulge. Facet arthrosis and ligamentum flavum hypertrophy. Suspected at least moderate spinal canal stenosis. Moderate bilateral neural foraminal narrowing. L4-L5: Disc bulge. Moderate facet arthrosis with ligamentum flavum hypertrophy. Suspected severe spinal canal stenosis. Moderate bilateral neural foraminal narrowing. L5-S1: Disc bulge with endplate spurring and superimposed partially calcified small central disc protrusion. Facet arthrosis with ligamentum flavum hypertrophy. Suspected mild/moderate central canal stenosis with bilateral subarticular narrowing. Moderate left neural foraminal narrowing. IMPRESSION: No evidence of acute fracture to the lumbar spine. Chronic vertebral compression fractures at T12, L3, L4 and L5. Prior vertebral augmentation at T12 and L4. Lumbar spondylosis as detailed. Spinal canal stenosis is greatest at L4-L5, where there is suspected multifactorial severe spinal canal stenosis. Sites of mild and moderate neural foraminal narrowing, as detailed. Lumbar levocurvature. Electronically Signed   By: Kellie Simmering DO   On: 07/10/2020 15:37      Impression / Plan:   Khelani Kops is a 87 y.o. y/o female to the emergency room with lower back pain and was noted to have a distended abdomen and palpable mass which led to a CT  scan of the abdomen which showed a mass in the cecum suggestive of a neoplasm.  I have been called for evaluation of the same.  In addition large amount of stool within the colon suggestive of constipation.  Patient unable to give a history.  Seen by Dr. Grayland Ormond oncology.  I agree that further treatment if a cecal mass were to be malignancy could involve surgery, chemotherapy and unsure patient's tolerability and family's goals of care wishes.  From my end to perform a colonoscopy she would  Need  to drink the entire bowel prep which I believe is going to be extremely hard for her to accomplish.  And if unable to drink a bowel prep, I would not be able to do a colonoscopy as she would not be cleaned out .  I do believe discussion about goals of care would be appropriate at this point of time. I would suggest irrespective of the final goal of treatment, consider daily MiraLAX, and enema to help evacuate bowel contents which appears to have a lot of stool and will give her significant relief in terms of her abdominal discomfort and distention    Thank you for involving me in the care of this patient.  LOS: 1 day   Shelia Bellows, MD  07/11/2020, 8:12 AM

## 2020-07-11 NOTE — Consult Note (Signed)
[place]  [phone] [phone]) [phone]  ID: Octavia Heir OB: June 28, 1925  MR#: [id number]  GUY#:[id number]  Patient Care Team: Tracie Harrier, MD as PCP - General (Internal Medicine)  CHIEF COMPLAINT: Cecal mass, consistent with malignancy.  INTERVAL HISTORY: Patient is a 87 year old female recently presented to the emergency room complaining of low back pain, difficulty with ambulation for several days and declining performance status.  Subsequent work-up revealed a cecal mass consistent with underlying malignancy.  Patient is hard of hearing and by report has underlying dementia therefore review of systems is difficult without family members at bedside.  She only complains of a poor appetite and abdominal pain.  REVIEW OF SYSTEMS:   Review of Systems  Unable to perform ROS: Medical condition    PAST MEDICAL HISTORY: Past Medical History:  Diagnosis Date  . Breast cancer ([place])   . Hypertension     PAST SURGICAL HISTORY: Past Surgical History:  Procedure Laterality Date  . ABDOMINAL HYSTERECTOMY    . BACK SURGERY    . BREAST SURGERY    . KYPHOPLASTY N/A 02/08/2017   Procedure: [id number];  Surgeon: Hessie Knows, MD;  Location: [facility] ORS;  Service: Orthopedics;  Laterality: N/A;  . MASTECTOMY      FAMILY HISTORY: History reviewed. No pertinent family history.  ADVANCED DIRECTIVES (Y/N):  @ADVDIR @  HEALTH MAINTENANCE: Social History   Tobacco Use  . Smoking status: Never Smoker  Substance Use Topics  . Alcohol use: No  . Drug use: No     Colonoscopy:  PAP:  Bone density:  Lipid panel:  Allergies  Allergen Reactions  . Codeine Anaphylaxis  . Alendronate Other (See Comments)    GI upset.    Current Facility-Administered Medications  Medication Dose Route Frequency Provider Last Rate Last Admin  . acetaminophen (TYLENOL) tablet 650 mg  650 mg Oral Q6H PRN Agbata, Tochukwu, MD      . beta carotene  w/minerals (OCUVITE) tablet 1 tablet  1 tablet Oral Daily Agbata, Tochukwu, MD      . cholecalciferol (VITAMIN D3) tablet 1,000 Units  1,000 Units Oral Daily Agbata, Tochukwu, MD   1,000 Units at 07/10/20 1821  . enoxaparin (LOVENOX) injection 40 mg  40 mg Subcutaneous Q24H Agbata, Tochukwu, MD      . hydrochlorothiazide (HYDRODIURIL) tablet 25 mg  25 mg Oral Daily Agbata, Tochukwu, MD      . HYDROcodone-acetaminophen (NORCO/VICODIN) 5-325 MG per tablet 1 tablet  1 tablet Oral Q6H PRN Agbata, Tochukwu, MD   1 tablet at 07/11/20 0158  . lidocaine (LIDODERM) 5 % 1 patch  1 patch Transdermal Q24H Nolberto Hanlon, MD      . magnesium gluconate (MAGONATE) tablet 250 mg  250 mg Oral Daily Agbata, Tochukwu, MD   250 mg at 07/10/20 1821  . ondansetron (ZOFRAN) tablet 4 mg  4 mg Oral Q6H PRN Agbata, Tochukwu, MD       Or  . ondansetron (ZOFRAN) injection 4 mg  4 mg Intravenous Q6H PRN Agbata, Tochukwu, MD      . pantoprazole (PROTONIX) EC tablet 40 mg  40 mg Oral Daily Agbata, Tochukwu, MD   40 mg at 07/10/20 1821  . polyethylene glycol (MIRALAX / GLYCOLAX) packet 17 g  17 g Oral Daily Agbata, Tochukwu, MD      . polyvinyl alcohol (LIQUIFILM TEARS) 1.4 % ophthalmic solution 1-2 drop  1-2 drop Both Eyes TID PRN Agbata, Tochukwu, MD      . pravastatin (PRAVACHOL) tablet  40 mg  40 mg Oral QHS Agbata, Tochukwu, MD   40 mg at 07/10/20 2154  . sodium phosphate (FLEET) 7-19 GM/118ML enema 1 enema  1 enema Rectal Daily PRN Mansy, Arvella Merles, MD      . Vitamin D (Ergocalciferol) (DRISDOL) capsule 50,000 Units  50,000 Units Oral Q Donneta Romberg, Tochukwu, MD        OBJECTIVE: Vitals:   07/11/20 0440 07/11/20 0838  BP: (!) 147/86 (!) 142/80  Pulse: (!) 107 100  Resp: 19 16  Temp: 97.9 F (36.6 C) 98.1 F (36.7 C)  SpO2: 94% 94%     Body mass index is 20.12 kg/m.    ECOG FS:3 - Symptomatic, >50% confined to bed  General: Thin, no acute distress. Eyes: Pink conjunctiva, anicteric sclera. HEENT: Normocephalic, moist  mucous membranes. Lungs: No audible wheezing or coughing. Heart: Regular rate and rhythm. Abdomen: Moderate tenderness to palpation of right lower quadrant. Musculoskeletal: No edema, cyanosis, or clubbing. Neuro: Alert, answering all questions appropriately. Cranial nerves grossly intact. Skin: No rashes or petechiae noted. Psych: Normal affect.  LAB RESULTS:  Lab Results  Component Value Date   NA 134 (L) 07/11/2020   K 3.8 07/11/2020   CL 98 07/11/2020   CO2 27 07/11/2020   GLUCOSE 131 (H) 07/11/2020   BUN 11 07/11/2020   CREATININE 0.47 07/11/2020   CALCIUM 8.6 (L) 07/11/2020   PROT 5.2 (L) 07/11/2020   ALBUMIN 2.5 (L) 07/11/2020   AST 13 (L) 07/11/2020   ALT 8 07/11/2020   ALKPHOS 55 07/11/2020   BILITOT 1.0 07/11/2020   GFRNONAA >60 07/11/2020   GFRAA >60 10/08/2015    Lab Results  Component Value Date   WBC 7.8 07/10/2020   NEUTROABS 6.0 07/10/2020   HGB 7.8 (L) 07/10/2020   HCT 25.5 (L) 07/10/2020   MCV 72.2 (L) 07/10/2020   PLT 348 07/10/2020     STUDIES: DG Chest 2 View  Result Date: 07/10/2020 CLINICAL DATA:  Fall, low back pain and weakness. EXAM: CHEST - 2 VIEW COMPARISON:  Chest x-ray dated 10/08/2015. FINDINGS: Heart size and mediastinal contours are stable. Chronic bronchitic changes centrally. No confluent opacity to suggest a developing pneumonia. No pleural effusion or pneumothorax is seen. No acute appearing osseous abnormality. Degenerative spondylosis of the slightly kyphotic thoracic spine. IMPRESSION: No acute findings. No evidence of pneumonia or pulmonary edema. Electronically Signed   By: Franki Cabot M.D.   On: 07/10/2020 12:50   DG Lumbar Spine 2-3 Views  Result Date: 07/10/2020 CLINICAL DATA:  [place] on Monday. LOWER back pain and weakness since fall. EXAM: LUMBAR SPINE - 2-3 VIEW COMPARISON:  02/08/2017 FINDINGS: There is normal alignment of the lumbar spine. Patient has had prior vertebral augmentation at T12 and L4. There are chronic  superior endplate fractures of L3 and L5. There is question of anterior cortical discontinuity involving the L1 and L2 vertebral bodies. Detail limited given presence of significant overlying bowel gas. Nonobstructive bowel gas pattern. There is atherosclerotic calcification of the abdominal aorta. Remote LEFT hip ORIF IMPRESSION: Possible compression fractures of L1 and L2. Consider further evaluation with CT of the lumbar spine. Remote vertebral augmentation at T12 and L4. Electronically Signed   By: Nolon Nations M.D.   On: 07/10/2020 12:46   DG Abdomen 1 View  Result Date: 07/10/2020 CLINICAL DATA:  Abdominal pain, fall on Monday, low back pain and weakness since. Abdominal distension. EXAM: ABDOMEN - 1 VIEW COMPARISON:  Plain film of the abdomen  dated 11/22/2009. FINDINGS: Moderately distended gas-filled loops of small bowel within the central/LEFT abdomen. Air is seen within the colon. No evidence of free intraperitoneal air. Degenerative spondylosis of the scoliotic thoracolumbar spine. Diffuse osteopenia limits characterization of osseous detail, but no evidence of acute osseous abnormality is appreciated. Changes of vertebroplasty within the lower thoracic and lumbar spine. IMPRESSION: Moderately distended gas-filled loops of small bowel within the central/LEFT abdomen. Findings could represent a partial small bowel obstruction or ileus. Electronically Signed   By: Franki Cabot M.D.   On: 07/10/2020 12:52   CT Abdomen Pelvis W Contrast  Result Date: 07/10/2020 CLINICAL DATA:  Fall on Monday, low back pain since. Abdominal distension. Bowel obstruction suspected. EXAM: CT ABDOMEN AND PELVIS WITH CONTRAST TECHNIQUE: Multidetector CT imaging of the abdomen and pelvis was performed using the standard protocol following bolus administration of intravenous contrast. CONTRAST:  17mL OMNIPAQUE IOHEXOL 300 MG/ML  SOLN COMPARISON:  CT abdomen dated 01/09/2010. FINDINGS: Lower chest: Bibasilar  atelectasis. Hepatobiliary: No focal liver abnormality is seen. Gallbladder is unremarkable. No bile duct dilatation. Pancreas: Unremarkable. No pancreatic ductal dilatation or surrounding inflammatory changes. Spleen: Splenic cysts. Adrenals/Urinary Tract: Adrenal glands are unremarkable. LEFT renal cysts, largest measuring approximately 6 cm greatest dimension. Small nonobstructing renal stones bilaterally. No suspicious mass or hydronephrosis. No perinephric fluid. No evidence of renal injury. No ureteral or bladder calculi are identified. Bladder appears normal, partially decompressed. Stomach/Bowel: Diverticulosis of the sigmoid colon but no focal inflammatory change to suggest acute diverticulitis. Moderate amount of stool and gas throughout the normal-caliber colon. No dilated small bowel loops are seen on this exam. No evidence of bowel wall inflammation. Irregular mass-like thickening within the cecum, measuring 4.6 cm greatest dimension and approximately 2 cm thickness, suspicious for neoplastic growth. This masslike thickening is adjacent to the terminal ileum. Appendix is not convincingly seen but there are no focal inflammatory changes about the cecum to suggest acute appendicitis. Vascular/Lymphatic: Atherosclerosis of the ectatic abdominal aorta. The no acute appearing vascular abnormality. No enlarged lymph nodes are seen within the abdomen or pelvis. Reproductive: Chronic LEFT adnexal cyst. Presumed hysterectomy. No free fluid or inflammatory change within either adnexal region. Other: No free fluid or abscess collection. No free intraperitoneal air. Musculoskeletal: No acute or suspicious osseous abnormality. No evidence of acute osseous fracture or dislocation. IMPRESSION: 1. Irregular mass-like thickening within the cecum, measuring 4.6 cm greatest dimension and approximately 2 cm thickness, located within the cecum adjacent to the terminal ileum, highly suspicious for malignancy. Consider  further characterization with nonemergent colonoscopy. 2. No evidence of bowel obstruction. Moderate amount of stool and gas throughout the mildly distended colon suggesting constipation or mild colonic ileus. No small bowel dilatation. 3. Colonic diverticulosis without evidence of acute diverticulitis. 4. Bilateral nephrolithiasis. No ureteral or bladder calculi. No hydronephrosis. Aortic Atherosclerosis (ICD10-I70.0). Electronically Signed   By: Franki Cabot M.D.   On: 07/10/2020 13:03   CT L-SPINE NO CHARGE  Result Date: 07/10/2020 CLINICAL DATA:  Compression fracture, L spine. EXAM: CT LUMBAR SPINE WITHOUT CONTRAST TECHNIQUE: Multidetector CT imaging of the lumbar spine was performed without intravenous contrast administration. Multiplanar CT image reconstructions were also generated. COMPARISON:  CT abdomen/pelvis 07/10/2020. Lumbar spine radiographs 07/10/2020. FINDINGS: Segmentation: 5 lumbar vertebrae. The caudal most well-formed intervertebral disc space is designated L5-S1 Alignment: Lumbar levocurvature.  Mild T12-L1 grade 1 retrolisthesis Vertebrae: Redemonstrated chronic T12, L3, L4 and L5 vertebral compression fracture deformities. Sequela of prior vertebral augmentation at T12 and L4. No L1 or  L2 compression fracture is identified (as was questioned on the lumbar spine radiographs performed earlier today). No evidence of acute fracture to the lumbar spine. Paraspinal and other soft tissues: Please refer to same day CT abdomen/pelvis for description of abdominopelvic soft tissue findings. Paraspinal soft tissues within normal limits. Disc levels: Moderate disc space narrowing within the visualized lower thoracic spine and at T12-L1. Moderate disc space narrowing at L5-S1. Otherwise, there is no more than mild disc space narrowing within the lumbar spine. T12-L1: Mild bony retropulsion at the level of the T12 inferior endplate. Associated disc bulge. Mild relative spinal canal narrowing. Mild  bilateral bony neural foraminal narrowing. L1-L2: Disc bulge. No appreciable significant spinal canal stenosis. Mild bilateral neural foraminal narrowing. L2-L3: Disc bulge. Facet arthrosis. Suspected moderate spinal canal stenosis. Bilateral neural foraminal narrowing (moderate right, mild left). L3-L4: Disc bulge. Facet arthrosis and ligamentum flavum hypertrophy. Suspected at least moderate spinal canal stenosis. Moderate bilateral neural foraminal narrowing. L4-L5: Disc bulge. Moderate facet arthrosis with ligamentum flavum hypertrophy. Suspected severe spinal canal stenosis. Moderate bilateral neural foraminal narrowing. L5-S1: Disc bulge with endplate spurring and superimposed partially calcified small central disc protrusion. Facet arthrosis with ligamentum flavum hypertrophy. Suspected mild/moderate central canal stenosis with bilateral subarticular narrowing. Moderate left neural foraminal narrowing. IMPRESSION: No evidence of acute fracture to the lumbar spine. Chronic vertebral compression fractures at T12, L3, L4 and L5. Prior vertebral augmentation at T12 and L4. Lumbar spondylosis as detailed. Spinal canal stenosis is greatest at L4-L5, where there is suspected multifactorial severe spinal canal stenosis. Sites of mild and moderate neural foraminal narrowing, as detailed. Lumbar levocurvature. Electronically Signed   By: Kellie Simmering DO   On: 07/10/2020 15:37    ASSESSMENT: Cecal mass, consistent with malignancy.  PLAN:    1. Cecal mass, consistent with malignancy: CEA pending at time of dictation.  CT scan results reviewed independently consistent with colon cancer.  Appreciate surgical input.  No acute need for surgical intervention, unclear if patient would be able to tolerate resection.  If necessary, chemotherapy would be more detrimental than helpful.  Case discussed with daughter over the phone.  Palliative care consulted to discuss goals of care.  Although not discussed with daughter,  hospice would be appropriate if surgery is not an option.  2.  Iron deficiency anemia: Likely secondary to underlying malignancy and possibly poor dietary intake. One dose of IV Feraheme has been ordered.  Appreciate consult, will follow.   Lloyd Huger, MD   07/11/2020 9:52 AM

## 2020-07-11 NOTE — Progress Notes (Signed)
PROGRESS NOTE    Shelia Stewart  WIO:[id number] DOB: 1925/10/03 DOA: 07/10/2020 PCP: Tracie Harrier, MD    Brief Narrative:  Shelia Stewart is a 87 y.o. female with medical history significant for breast cancer status post mastectomy and hypertension who presents to the emergency room via EMS for evaluation of low back pain and difficulty with ambulation for couple of days. According to patient's daughter, she found her mother on the floor on Monday, 07/04/20.  She was able to help her get up and patient was able to ambulate with assistance to the bathroom which is her baseline. 2 days prior to her hospitalization patient appeared to be very weak and has been unable to ambulate and spent the whole day in the recliner 1 day prior to her admission.  She is afraid to get up due to pain in her back. Upon arrival to the ER she was noted to have a distended abdomen and a palpable mass in the right lumbar/lower quadrant.  Her daughter states that she has not had a bowel movement in a week which is not unusual for her since she takes stool softeners at home.  Her daughter also states that she has not had any nausea or vomiting.  4/18-does not complain of pain to me this am.    Consultants:   Surgery, GI, oncology, neurosurgery  Procedures:   Antimicrobials:       Subjective: No shortness of breath, chest pain or abdominal pain  Objective: Vitals:   07/10/20 2201 07/11/20 0440 07/11/20 0838 07/11/20 1111  BP: (!) 146/70 (!) 147/86 (!) 142/80 137/70  Pulse: 96 (!) 107 100 97  Resp: 18 19 16 16   Temp: 98 F (36.7 C) 97.9 F (36.6 C) 98.1 F (36.7 C) 97.7 F (36.5 C)  TempSrc: Oral  Oral Oral  SpO2: 94% 94% 94% 95%  Weight:      Height:        Intake/Output Summary (Last 24 hours) at 07/11/2020 1520 Last data filed at 07/11/2020 0900 Gross per 24 hour  Intake 40 ml  Output 600 ml  Net -560 ml   Filed Weights   07/10/20 1048  Weight: 49.9 kg     Examination:  General exam: Appears calm and comfortable  Respiratory system: Clear to auscultation. Respiratory effort normal. Cardiovascular system: S1 & S2 heard, RRR. No JVD, murmurs, rubs, gallops or clicks.  Gastrointestinal system: Abdomen is nondistended, soft and nontender.  Normal bowel sounds heard. Central nervous system: Grossly intact. appetrs confused Extremities: No edema Skin: Warm dry Psychiatry:  Mood & affect appropriate.     Data Reviewed: I have personally reviewed following labs and imaging studies  CBC: Recent Labs  Lab 07/10/20 1055  WBC 7.8  NEUTROABS 6.0  HGB 7.8*  HCT 25.5*  MCV 72.2*  PLT 426   Basic Metabolic Panel: Recent Labs  Lab 07/10/20 1055 07/11/20 0307  NA 133* 134*  K 3.8 3.8  CL 101 98  CO2 26 27  GLUCOSE 112* 131*  BUN 17 11  CREATININE 0.58 0.47  CALCIUM 8.4* 8.6*   GFR: Estimated Creatinine Clearance: 34.6 mL/min (by C-G formula based on SCr of 0.47 mg/dL). Liver Function Tests: Recent Labs  Lab 07/10/20 1055 07/11/20 0307  AST 12* 13*  ALT 8 8  ALKPHOS 53 55  BILITOT 0.9 1.0  PROT 5.8* 5.2*  ALBUMIN 2.9* 2.5*   Recent Labs  Lab 07/10/20 1055  LIPASE 27   No results for input(s): AMMONIA  in the last 168 hours. Coagulation Profile: No results for input(s): INR, PROTIME in the last 168 hours. Cardiac Enzymes: No results for input(s): CKTOTAL, CKMB, CKMBINDEX, TROPONINI in the last 168 hours. BNP (last 3 results) No results for input(s): PROBNP in the last 8760 hours. HbA1C: No results for input(s): HGBA1C in the last 72 hours. CBG: No results for input(s): GLUCAP in the last 168 hours. Lipid Profile: No results for input(s): CHOL, HDL, LDLCALC, TRIG, CHOLHDL, LDLDIRECT in the last 72 hours. Thyroid Function Tests: No results for input(s): TSH, T4TOTAL, FREET4, T3FREE, THYROIDAB in the last 72 hours. Anemia Panel: Recent Labs    07/10/20 1055  FERRITIN 21  TIBC 322  IRON 12*   Sepsis  Labs: No results for input(s): PROCALCITON, LATICACIDVEN in the last 168 hours.  Recent Results (from the past 240 hour(s))  Resp Panel by RT-PCR (Flu A&B, Covid) Nasopharyngeal Swab     Status: None   Collection Time: 07/10/20 12:49 PM   Specimen: Nasopharyngeal Swab; Nasopharyngeal(NP) swabs in vial transport medium  Result Value Ref Range Status   SARS Coronavirus 2 by RT PCR NEGATIVE NEGATIVE Final    Comment: (NOTE) SARS-CoV-2 target nucleic acids are NOT DETECTED.  The SARS-CoV-2 RNA is generally detectable in upper respiratory specimens during the acute phase of infection. The lowest concentration of SARS-CoV-2 viral copies this assay can detect is 138 copies/mL. A negative result does not preclude SARS-Cov-2 infection and should not be used as the sole basis for treatment or other patient management decisions. A negative result may occur with  improper specimen collection/handling, submission of specimen other than nasopharyngeal swab, presence of viral mutation(s) within the areas targeted by this assay, and inadequate number of viral copies(<138 copies/mL). A negative result must be combined with clinical observations, patient history, and epidemiological information. The expected result is Negative.  Fact Sheet for Patients:  EntrepreneurPulse.com.au  Fact Sheet for Healthcare Providers:  IncredibleEmployment.be  This test is no t yet approved or cleared by the [place] FDA and  has been authorized for detection and/or diagnosis of SARS-CoV-2 by FDA under an Emergency Use Authorization (EUA). This EUA will remain  in effect (meaning this test can be used) for the duration of the COVID-19 declaration under Section 564(b)(1) of the Act, 21 U.S.C.section 360bbb-3(b)(1), unless the authorization is terminated  or revoked sooner.       Influenza A by PCR NEGATIVE NEGATIVE Final   Influenza B by PCR NEGATIVE NEGATIVE Final     Comment: (NOTE) The Xpert Xpress SARS-CoV-2/FLU/RSV plus assay is intended as an aid in the diagnosis of influenza from Nasopharyngeal swab specimens and should not be used as a sole basis for treatment. Nasal washings and aspirates are unacceptable for Xpert Xpress SARS-CoV-2/FLU/RSV testing.  Fact Sheet for Patients: EntrepreneurPulse.com.au  Fact Sheet for Healthcare Providers: IncredibleEmployment.be  This test is not yet approved or cleared by the [place] FDA and has been authorized for detection and/or diagnosis of SARS-CoV-2 by FDA under an Emergency Use Authorization (EUA). This EUA will remain in effect (meaning this test can be used) for the duration of the COVID-19 declaration under Section 564(b)(1) of the Act, 21 U.S.C. section 360bbb-3(b)(1), unless the authorization is terminated or revoked.  Performed at [facility], [address] [place], [place] [address]          Radiology Studies: DG Chest 2 View  Result Date: 07/10/2020 CLINICAL DATA:  Fall, low back pain and weakness. EXAM: CHEST - 2  VIEW COMPARISON:  Chest x-ray dated 10/08/2015. FINDINGS: Heart size and mediastinal contours are stable. Chronic bronchitic changes centrally. No confluent opacity to suggest a developing pneumonia. No pleural effusion or pneumothorax is seen. No acute appearing osseous abnormality. Degenerative spondylosis of the slightly kyphotic thoracic spine. IMPRESSION: No acute findings. No evidence of pneumonia or pulmonary edema. Electronically Signed   By: Franki Cabot M.D.   On: 07/10/2020 12:50   DG Lumbar Spine 2-3 Views  Result Date: 07/10/2020 CLINICAL DATA:  [place] on Monday. LOWER back pain and weakness since fall. EXAM: LUMBAR SPINE - 2-3 VIEW COMPARISON:  02/08/2017 FINDINGS: There is normal alignment of the lumbar spine. Patient has had prior vertebral augmentation at T12 and L4. There are chronic superior endplate  fractures of L3 and L5. There is question of anterior cortical discontinuity involving the L1 and L2 vertebral bodies. Detail limited given presence of significant overlying bowel gas. Nonobstructive bowel gas pattern. There is atherosclerotic calcification of the abdominal aorta. Remote LEFT hip ORIF IMPRESSION: Possible compression fractures of L1 and L2. Consider further evaluation with CT of the lumbar spine. Remote vertebral augmentation at T12 and L4. Electronically Signed   By: Nolon Nations M.D.   On: 07/10/2020 12:46   DG Abdomen 1 View  Result Date: 07/10/2020 CLINICAL DATA:  Abdominal pain, fall on Monday, low back pain and weakness since. Abdominal distension. EXAM: ABDOMEN - 1 VIEW COMPARISON:  Plain film of the abdomen dated 11/22/2009. FINDINGS: Moderately distended gas-filled loops of small bowel within the central/LEFT abdomen. Air is seen within the colon. No evidence of free intraperitoneal air. Degenerative spondylosis of the scoliotic thoracolumbar spine. Diffuse osteopenia limits characterization of osseous detail, but no evidence of acute osseous abnormality is appreciated. Changes of vertebroplasty within the lower thoracic and lumbar spine. IMPRESSION: Moderately distended gas-filled loops of small bowel within the central/LEFT abdomen. Findings could represent a partial small bowel obstruction or ileus. Electronically Signed   By: Franki Cabot M.D.   On: 07/10/2020 12:52   CT Abdomen Pelvis W Contrast  Result Date: 07/10/2020 CLINICAL DATA:  Fall on Monday, low back pain since. Abdominal distension. Bowel obstruction suspected. EXAM: CT ABDOMEN AND PELVIS WITH CONTRAST TECHNIQUE: Multidetector CT imaging of the abdomen and pelvis was performed using the standard protocol following bolus administration of intravenous contrast. CONTRAST:  159mL OMNIPAQUE IOHEXOL 300 MG/ML  SOLN COMPARISON:  CT abdomen dated 01/09/2010. FINDINGS: Lower chest: Bibasilar atelectasis. Hepatobiliary:  No focal liver abnormality is seen. Gallbladder is unremarkable. No bile duct dilatation. Pancreas: Unremarkable. No pancreatic ductal dilatation or surrounding inflammatory changes. Spleen: Splenic cysts. Adrenals/Urinary Tract: Adrenal glands are unremarkable. LEFT renal cysts, largest measuring approximately 6 cm greatest dimension. Small nonobstructing renal stones bilaterally. No suspicious mass or hydronephrosis. No perinephric fluid. No evidence of renal injury. No ureteral or bladder calculi are identified. Bladder appears normal, partially decompressed. Stomach/Bowel: Diverticulosis of the sigmoid colon but no focal inflammatory change to suggest acute diverticulitis. Moderate amount of stool and gas throughout the normal-caliber colon. No dilated small bowel loops are seen on this exam. No evidence of bowel wall inflammation. Irregular mass-like thickening within the cecum, measuring 4.6 cm greatest dimension and approximately 2 cm thickness, suspicious for neoplastic growth. This masslike thickening is adjacent to the terminal ileum. Appendix is not convincingly seen but there are no focal inflammatory changes about the cecum to suggest acute appendicitis. Vascular/Lymphatic: Atherosclerosis of the ectatic abdominal aorta. The no acute appearing vascular abnormality. No enlarged lymph nodes are  seen within the abdomen or pelvis. Reproductive: Chronic LEFT adnexal cyst. Presumed hysterectomy. No free fluid or inflammatory change within either adnexal region. Other: No free fluid or abscess collection. No free intraperitoneal air. Musculoskeletal: No acute or suspicious osseous abnormality. No evidence of acute osseous fracture or dislocation. IMPRESSION: 1. Irregular mass-like thickening within the cecum, measuring 4.6 cm greatest dimension and approximately 2 cm thickness, located within the cecum adjacent to the terminal ileum, highly suspicious for malignancy. Consider further characterization with  nonemergent colonoscopy. 2. No evidence of bowel obstruction. Moderate amount of stool and gas throughout the mildly distended colon suggesting constipation or mild colonic ileus. No small bowel dilatation. 3. Colonic diverticulosis without evidence of acute diverticulitis. 4. Bilateral nephrolithiasis. No ureteral or bladder calculi. No hydronephrosis. Aortic Atherosclerosis (ICD10-I70.0). Electronically Signed   By: Franki Cabot M.D.   On: 07/10/2020 13:03   CT L-SPINE NO CHARGE  Result Date: 07/10/2020 CLINICAL DATA:  Compression fracture, L spine. EXAM: CT LUMBAR SPINE WITHOUT CONTRAST TECHNIQUE: Multidetector CT imaging of the lumbar spine was performed without intravenous contrast administration. Multiplanar CT image reconstructions were also generated. COMPARISON:  CT abdomen/pelvis 07/10/2020. Lumbar spine radiographs 07/10/2020. FINDINGS: Segmentation: 5 lumbar vertebrae. The caudal most well-formed intervertebral disc space is designated L5-S1 Alignment: Lumbar levocurvature.  Mild T12-L1 grade 1 retrolisthesis Vertebrae: Redemonstrated chronic T12, L3, L4 and L5 vertebral compression fracture deformities. Sequela of prior vertebral augmentation at T12 and L4. No L1 or L2 compression fracture is identified (as was questioned on the lumbar spine radiographs performed earlier today). No evidence of acute fracture to the lumbar spine. Paraspinal and other soft tissues: Please refer to same day CT abdomen/pelvis for description of abdominopelvic soft tissue findings. Paraspinal soft tissues within normal limits. Disc levels: Moderate disc space narrowing within the visualized lower thoracic spine and at T12-L1. Moderate disc space narrowing at L5-S1. Otherwise, there is no more than mild disc space narrowing within the lumbar spine. T12-L1: Mild bony retropulsion at the level of the T12 inferior endplate. Associated disc bulge. Mild relative spinal canal narrowing. Mild bilateral bony neural foraminal  narrowing. L1-L2: Disc bulge. No appreciable significant spinal canal stenosis. Mild bilateral neural foraminal narrowing. L2-L3: Disc bulge. Facet arthrosis. Suspected moderate spinal canal stenosis. Bilateral neural foraminal narrowing (moderate right, mild left). L3-L4: Disc bulge. Facet arthrosis and ligamentum flavum hypertrophy. Suspected at least moderate spinal canal stenosis. Moderate bilateral neural foraminal narrowing. L4-L5: Disc bulge. Moderate facet arthrosis with ligamentum flavum hypertrophy. Suspected severe spinal canal stenosis. Moderate bilateral neural foraminal narrowing. L5-S1: Disc bulge with endplate spurring and superimposed partially calcified small central disc protrusion. Facet arthrosis with ligamentum flavum hypertrophy. Suspected mild/moderate central canal stenosis with bilateral subarticular narrowing. Moderate left neural foraminal narrowing. IMPRESSION: No evidence of acute fracture to the lumbar spine. Chronic vertebral compression fractures at T12, L3, L4 and L5. Prior vertebral augmentation at T12 and L4. Lumbar spondylosis as detailed. Spinal canal stenosis is greatest at L4-L5, where there is suspected multifactorial severe spinal canal stenosis. Sites of mild and moderate neural foraminal narrowing, as detailed. Lumbar levocurvature. Electronically Signed   By: Kellie Simmering DO   On: 07/10/2020 15:37        Scheduled Meds: . beta carotene w/minerals  1 tablet Oral Daily  . cholecalciferol  1,000 Units Oral Daily  . enoxaparin (LOVENOX) injection  40 mg Subcutaneous Q24H  . hydrochlorothiazide  25 mg Oral Daily  . lidocaine  1 patch Transdermal Q24H  . magnesium gluconate  250  mg Oral Daily  . pantoprazole  40 mg Oral Daily  . polyethylene glycol  17 g Oral Daily  . pravastatin  40 mg Oral QHS  . Vitamin D (Ergocalciferol)  50,000 Units Oral Q Mon   Continuous Infusions:  Assessment & Plan:   Principal Problem:   Back pain, acute Active Problems:    Abdominal pain   Constipation   Colonic mass   Anemia of chronic disease   Fall   Compression fracture of L1 lumbar vertebra (HCC)   Compression fracture of L2 lumbar vertebra (HCC)   Essential hypertension   Iron deficiency anemia   Back pain/compression fractures of L1 and L2 Following a fall Lumbar spine x-ray shows possible compression fractures involving L1 and L2 Awaiting results of CT scan of the lumbar spine Place patient on fall precautions Pain control with Lortab Patient will need IR consult for kyphoplasty if CT scan confirms acute fractures.    Mass of colon/constipation /anemia of chronic disease Patient noted to have a distended abdomen with a palpable mass in the right lumbar/lower quadrant CT scan of abdomen and pelvis shows  Irregular mass-like thickening within the cecum General surgery- no urgent surgical intervention GI consulted-drinking the prep for colonoscopy would be very difficult for him to be calm/ Recommend palliative care GI recommends daily MiraLAX and enema to help evacuate bowel contents Oncology consulted-chemo will be more detrimental than helpful Follow-up with palliative care Transfuse if hemoglobin less than 7 6-week iron deficiency secondary to underlying malignancy and possibly poor dietary intake 1 dose of IV Feraheme-order by oncology     Hypertension Continue hydrochlorothiazide  Probable Dementia- daughter stated for past few months pt has been confused. No official diagnosis of dementia.  May need to f/u with pcp as outpt for further evaluation   DVT prophylaxis: Lovenox Code Status: DNR Family Communication: daughter updated  Status is: Inpatient  Remains inpatient appropriate because:Inpatient level of care appropriate due to severity of illness   Dispo: The patient is from: Home              Anticipated d/c is to: TBD              Patient currently is not medically stable to d/c.   Difficult to place patient  No            LOS: 1 day   Time spent: 35 minutes with more than 50% on [place], MD [facility] Pager [phone]  If 7PM-7AM, please contact night-coverage 07/11/2020, 3:20 PM

## 2020-07-12 DIAGNOSIS — M545 Low back pain, unspecified: Secondary | ICD-10-CM | POA: Diagnosis not present

## 2020-07-12 DIAGNOSIS — Z515 Encounter for palliative care: Secondary | ICD-10-CM | POA: Diagnosis not present

## 2020-07-12 DIAGNOSIS — W19XXXD Unspecified fall, subsequent encounter: Secondary | ICD-10-CM

## 2020-07-12 LAB — CEA: CEA: 4.1 ng/mL (ref 0.0–4.7)

## 2020-07-12 MED ORDER — HALOPERIDOL LACTATE 5 MG/ML IJ SOLN
1.0000 mg | Freq: Four times a day (QID) | INTRAMUSCULAR | Status: AC | PRN
Start: 2020-07-12 — End: 2020-07-12
  Administered 2020-07-12: 1 mg via INTRAVENOUS
  Filled 2020-07-12: qty 1

## 2020-07-12 MED ORDER — HALOPERIDOL LACTATE 5 MG/ML IJ SOLN
1.0000 mg | Freq: Four times a day (QID) | INTRAMUSCULAR | Status: AC | PRN
  Administered 2020-07-12: 1 mg via INTRAVENOUS
  Filled 2020-07-12: qty 1

## 2020-07-12 MED ORDER — OCUVITE-LUTEIN PO CAPS
1.0000 | ORAL_CAPSULE | Freq: Every day | ORAL | Status: DC
  Administered 2020-07-13: 1 via ORAL
  Filled 2020-07-12: qty 1

## 2020-07-12 NOTE — Evaluation (Signed)
Occupational Therapy Evaluation Patient Details Name: Shelia Stewart MRN: [id number] DOB: 11-05-1925 Today's Date: 07/12/2020    History of Present Illness 87 y.o. female with medical history significant for breast cancer status post mastectomy and hypertension who presents to the emergency room via EMS for evaluation of low back pain and difficulty with ambulation for couple of days.  According to patient's daughter, she found her mother on the floor on Monday, 07/04/20.  She was able to help her get up and patient was able to ambulate with assistance to the bathroom which is her baseline.  2 days prior to her hospitalization patient appeared to be very weak and has been unable to ambulate and spent the whole day in the recliner 1 day prior to her admission.  She is afraid to get up due to pain in her back.   Clinical Impression   Patient seen for skilled co tx/evaluation with PT. Pt presenting with decreased cognition, self care, balance, functional mobility/transfers, endurance, and safety awareness. No family present to determine baseline. Pt with very tangential speech and difficult to understand at times as well as being [facility]. Per staff reports, pt lives with family and requires assistance for self care. She spends majority of time in bed per report but unable to confirm at this time. Patient currently needing max A of 2 for bed mobility and functional tasks. Pt was motivated to stand with Max +2 assistance and noted to have soiled linens and brief. Pt returning to bed and needing total A for rolling and second helper to assist with hygiene. Patient will benefit from acute OT to increase overall independence in the areas of ADLs, functional mobility, and safety awareness in order to safely discharge to next venue of care.  Per further chart review, after exiting the room, pt's family may make plans for pt to return home with hospice care. OT will continue with POC for now and follow closely  for changes.     Follow Up Recommendations  SNF;Supervision/Assistance - 24 hour    Equipment Recommendations  Other (comment) (If family requesting pt return home will likely need hospital bed for safety)       Precautions / Restrictions Precautions Precautions: Fall      Mobility Bed Mobility Overal bed mobility: Needs Assistance Bed Mobility: Rolling;Supine to Sit;Sit to Supine Rolling: Max assist;+2 for physical assistance   Supine to sit: Max assist;+2 for physical assistance Sit to supine: Max assist;+2 for physical assistance   General bed mobility comments: multimodal cuing for sequencing, initiation, and attention to task.    Transfers Overall transfer level: Needs assistance Equipment used: 2 person hand held assist Transfers: Sit to/from Stand Sit to Stand: Mod assist;Max assist;+2 physical assistance         General transfer comment: sit <>stand x 2 reps with +2 assistance for safety and balance. Pt motivated to stand this session.    Balance Overall balance assessment: Needs assistance Sitting-balance support: Feet supported Sitting balance-Leahy Scale: Fair     Standing balance support: During functional activity Standing balance-Leahy Scale: Zero Standing balance comment: +2 assist for safety                           ADL either performed or assessed with clinical judgement   ADL Overall ADL's : Needs assistance/impaired     Grooming: Wash/dry hands;Wash/dry face;Set up;Min guard;Sitting Grooming Details (indicate cue type and reason): min  guard for balance on  EOB with initial hand over hand assist for pt to initiate     Lower Body Bathing: Total assistance       Lower Body Dressing: Total assistance                 General ADL Comments: Pt's brief and linens soiled- pt needing max +2 assistance for bed mobility and total A for hygiene and clothing management.     Vision Baseline Vision/History: No visual  deficits Patient Visual Report: No change from baseline              Pertinent Vitals/Pain Pain Assessment: Faces Faces Pain Scale: Hurts even more Pain Location: back with mobility tasks Pain Descriptors / Indicators: Discomfort;Grimacing;Guarding Pain Intervention(s): Limited activity within patient's tolerance;Monitored during session;Repositioned     Hand Dominance Right   Extremity/Trunk Assessment Upper Extremity Assessment Upper Extremity Assessment: Overall WFL for tasks assessed   Lower Extremity Assessment Lower Extremity Assessment: Defer to PT evaluation   Cervical / Trunk Assessment Cervical / Trunk Assessment: Kyphotic   Communication Communication Communication: HOH   Cognition Arousal/Alertness: Awake/alert Behavior During Therapy: Restless Overall Cognitive Status: No family/caregiver present to determine baseline cognitive functioning                                 General Comments: hx of cognitive impairments at baseline. Pt is cooperative and pleasant. She does become very anxious with mobility and needing increased cuing to follow commands, sequence, and initiate tasks.              Home Living Family/patient expects to be discharged to:: Private residence Living Arrangements: Children Available Help at Discharge: Family;Available 24 hours/day                                    Prior Functioning/Environment Level of Independence: Needs assistance        Comments: Pt unable to verbalize home set up and no family present. Per staff report, pt lives with daughter and daughter has reported pt mostly in bed at home. Family assist with self care tasks and someone home with pt 24/7. Unsure of functional mobility at baseline.        OT Problem List: Decreased strength;Impaired balance (sitting and/or standing);Decreased cognition;Pain;Decreased activity tolerance;Decreased safety awareness;Decreased knowledge of use of  DME or AE;Cardiopulmonary status limiting activity      OT Treatment/Interventions: Self-care/ADL training;Manual therapy;Therapeutic exercise;Patient/family education;Modalities;Balance training;Energy conservation;Therapeutic activities;Cognitive remediation/compensation;DME and/or AE instruction    OT Goals(Current goals can be found in the care plan section) Acute Rehab OT Goals Patient Stated Goal: "we going to stand?" OT Goal Formulation: With patient Time For Goal Achievement: 07/26/20 Potential to Achieve Goals: Fair  OT Frequency: Min 2X/week   Barriers to D/C:    none known at this time       Co-evaluation PT/OT/SLP Co-Evaluation/Treatment: Yes Reason for Co-Treatment: Complexity of the patient's impairments (multi-system involvement);Necessary to address cognition/behavior during functional activity;For patient/therapist safety;To address functional/ADL transfers PT goals addressed during session: Mobility/safety with mobility OT goals addressed during session: ADL's and self-care      AM-PAC OT "6 Clicks" Daily Activity     Outcome Measure Help from another person eating meals?: A Little Help from another person taking care of personal grooming?: A Little Help from another person toileting, which includes using toliet, bedpan, or urinal?:  Total Help from another person bathing (including washing, rinsing, drying)?: A Lot Help from another person to put on and taking off regular upper body clothing?: A Lot Help from another person to put on and taking off regular lower body clothing?: Total 6 Click Score: 12   End of Session Nurse Communication: Mobility status  Activity Tolerance: Patient limited by pain Patient left: in bed;with call bell/phone within reach;with bed alarm set  OT Visit Diagnosis: Unsteadiness on feet (R26.81);Repeated falls (R29.6);Muscle weakness (generalized) (M62.81);History of falling (Z91.81)                Time: 8337-4451 OT Time  Calculation (min): 27 min Charges:  OT General Charges $OT Visit: 1 Visit OT Evaluation $OT Eval Moderate Complexity: [address] [place], OTR/L , CBIS ascom [phone]  07/12/20, 4:47 PM

## 2020-07-12 NOTE — Progress Notes (Signed)
Rechecked by nurse

## 2020-07-12 NOTE — Evaluation (Signed)
Physical Therapy Evaluation Patient Details Name: Shelia Stewart MRN: [id number] DOB: 08-Oct-1925 Today's Date: 07/12/2020   History of Present Illness  Pt is a 87 y.o. female presenting to hospital 4/17 with LBP and increasing weakness since fall on Monday (difficulty with ambulation).  Pt noted with distended abdomen and palpable mass in R lower quadrant: imaging showing cecal mass concerning for malignancy.  Imaging showing chronic vertebral compression fx's T12, L3, L4, L5 (no evidence of acute fx L-spine); per Neurosurgery note no plan for surgical intervention.  PMH includes htn, HLD, breast CA, dementia, HOH, back surgery, kyphoplasty, mastectomy.  Clinical Impression  PT/OT co-evaluation peformed.  Pt resting in bed upon PT/OT arrival; agreeable to therapy activities.  Pt appearing very HOH during session; pt talking during session and very pleasant but appearing confused based on pt's statements.  Currently pt is 2 assist with bed mobility; pt initially requiring assist with sitting balance but improved to close SBA; and performed standing x2 trials (with B hand hold assist)--pt appearing motivated to stand but requiring assist for safety and balance in standing.  Pt appearing with back pain intermittently with mobility but no pain sitting edge of bed or at rest (beginning/end of session).  Pt would benefit from skilled PT to address noted impairments and functional limitations (see below for any additional details).  Upon hospital discharge, pt would benefit from SNF to improve strength and functional mobility.  After session was completed, MD sent therapist secure message regarding looking at hospice for pt (per chart considering hospice home).  Will monitor pt's status and adjust plan of care as appropriate.    Follow Up Recommendations SNF;Supervision/Assistance - 24 hour    Equipment Recommendations  Rolling walker with 5" wheels;3in1 (PT);Wheelchair (measurements PT);Wheelchair  cushion (measurements PT);Hospital bed;Other (comment) (hoyer lift)    Recommendations for Other Services       Precautions / Restrictions Precautions Precautions: Fall Precaution Comments: LSO for comfort if needed Restrictions Weight Bearing Restrictions: No      Mobility  Bed Mobility Overal bed mobility: Needs Assistance Bed Mobility: Rolling;Supine to Sit;Sit to Supine Rolling: Max assist;+2 for physical assistance   Supine to sit: Max assist;+2 for physical assistance Sit to supine: Max assist;+2 for physical assistance   General bed mobility comments: vc's and tactile cues for technique    Transfers Overall transfer level: Needs assistance Equipment used: 2 person hand held assist Transfers: Sit to/from Stand Sit to Stand: Mod assist;Max assist;+2 physical assistance         General transfer comment: x2 trials standing; assist for safety and balance  Ambulation/Gait             General Gait Details: not safe to attempt gait at this time  Stairs            Wheelchair Mobility    Modified Rankin (Stroke Patients Only)       Balance Overall balance assessment: Needs assistance Sitting-balance support: No upper extremity supported;Feet supported Sitting balance-Leahy Scale: Fair Sitting balance - Comments: steady static sitting   Standing balance support: During functional activity Standing balance-Leahy Scale: Zero Standing balance comment: +2 assist for standing balance                             Pertinent Vitals/Pain Pain Assessment: Faces Faces Pain Scale: Hurts even more Pain Location: back with mobility tasks Pain Descriptors / Indicators: Discomfort;Grimacing;Guarding Pain Intervention(s): Limited activity within patient's tolerance;Monitored during  session;Repositioned    Home Living Family/patient expects to be discharged to:: Private residence Living Arrangements: Children Available Help at Discharge:  Family;Available 24 hours/day             Additional Comments: Pt did not verbalize home set-up    Prior Function Level of Independence: Needs assistance         Comments: Pt unable to verbalize home set up and no family present. Per staff report, pt lives with daughter and daughter has reported pt mostly in bed at home. Family assist with self care tasks and someone home with pt 24/7. Unsure of functional mobility at baseline.     Hand Dominance   Dominant Hand: Right    Extremity/Trunk Assessment   Upper Extremity Assessment Upper Extremity Assessment: Overall WFL for tasks assessed    Lower Extremity Assessment Lower Extremity Assessment: Generalized weakness    Cervical / Trunk Assessment Cervical / Trunk Assessment: Kyphotic  Communication   Communication: HOH  Cognition Arousal/Alertness: Awake/alert Behavior During Therapy: Restless Overall Cognitive Status: No family/caregiver present to determine baseline cognitive functioning                                 General Comments: H/o dementia per chart review.  Pt pleasant and participatory during session.  Pt appearing anxious with activity/mobility requiring increased cueing to follow commands.      General Comments  Per secure message with neurosurgery (Dr. Lacinda Axon) pt can have LSO for comfort but not needed for stability secondary not an acute fx.    Exercises     Assessment/Plan    PT Assessment Patient needs continued PT services  PT Problem List Decreased strength;Decreased activity tolerance;Decreased balance;Decreased mobility;Decreased knowledge of use of DME;Decreased safety awareness;Decreased knowledge of precautions;Pain       PT Treatment Interventions DME instruction;Gait training;Functional mobility training;Therapeutic activities;Therapeutic exercise;Balance training;Patient/family education    PT Goals (Current goals can be found in the Care Plan section)  Acute Rehab PT  Goals Patient Stated Goal: "we going to stand?" PT Goal Formulation: With patient Time For Goal Achievement: 07/26/20 Potential to Achieve Goals: Fair    Frequency Min 2X/week   Barriers to discharge Decreased caregiver support      Co-evaluation PT/OT/SLP Co-Evaluation/Treatment: Yes Reason for Co-Treatment: Complexity of the patient's impairments (multi-system involvement);Necessary to address cognition/behavior during functional activity;For patient/therapist safety;To address functional/ADL transfers PT goals addressed during session: Mobility/safety with mobility OT goals addressed during session: ADL's and self-care       AM-PAC PT "6 Clicks" Mobility  Outcome Measure Help needed turning from your back to your side while in a flat bed without using bedrails?: Total Help needed moving from lying on your back to sitting on the side of a flat bed without using bedrails?: Total Help needed moving to and from a bed to a chair (including a wheelchair)?: Total Help needed standing up from a chair using your arms (e.g., wheelchair or bedside chair)?: Total Help needed to walk in hospital room?: Total Help needed climbing 3-5 steps with a railing? : Total 6 Click Score: 6    End of Session Equipment Utilized During Treatment: Gait belt Activity Tolerance:  (limited by back pain at times) Patient left: in bed;with call bell/phone within reach;with bed alarm set Nurse Communication: Mobility status;Precautions PT Visit Diagnosis: Other abnormalities of gait and mobility (R26.89);Muscle weakness (generalized) (M62.81);History of falling (Z91.81);Pain    Time: 0867-6195 PT  Time Calculation (min) (ACUTE ONLY): 27 min   Charges:   PT Evaluation $PT Eval Low Complexity: 1 Low         Jamesmichael Shadd, PT 07/12/20, 3:10 PM

## 2020-07-12 NOTE — Progress Notes (Signed)
PROGRESS NOTE    Shelia Stewart  QQI:[id number] DOB: 10/12/1925 DOA: 07/10/2020 PCP: Tracie Harrier, MD    Brief Narrative:  Shelia Stewart is a 87 y.o. female with medical history significant for breast cancer status post mastectomy and hypertension who presents to the emergency room via EMS for evaluation of low back pain and difficulty with ambulation for couple of days. According to patient's daughter, she found her mother on the floor on Monday, 07/04/20.  She was able to help her get up and patient was able to ambulate with assistance to the bathroom which is her baseline. 2 days prior to her hospitalization patient appeared to be very weak and has been unable to ambulate and spent the whole day in the recliner 1 day prior to her admission.  She is afraid to get up due to pain in her back. Upon arrival to the ER she was noted to have a distended abdomen and a palpable mass in the right lumbar/lower quadrant.  Her daughter states that she has not had a bowel movement in a week which is not unusual for her since she takes stool softeners at home.  Her daughter also states that she has not had any nausea or vomiting.  4/18-does not complain of pain to me this am.  4/19-confused, and mumbling still.  Consultants:   Surgery, GI, oncology, neurosurgery  Procedures:   Antimicrobials:       Subjective: confused  Objective: Vitals:   07/11/20 1532 07/11/20 2001 07/12/20 0434 07/12/20 0805  BP: (!) 174/88 (!) 170/81 (!) 167/88 (!) 161/97  Pulse: (!) 108 (!) 105 (!) 107 (!) 118  Resp: 16 14 17 20   Temp: 98.3 F (36.8 C) 97.9 F (36.6 C) 98 F (36.7 C) 98.1 F (36.7 C)  TempSrc:  Oral Oral Axillary  SpO2: 95% 97% 93% 96%  Weight:      Height:        Intake/Output Summary (Last 24 hours) at 07/12/2020 0856 Last data filed at 07/11/2020 1900 Gross per 24 hour  Intake 0 ml  Output --  Net 0 ml   Filed Weights   07/10/20 1048  Weight: 49.9 kg     Examination: Nad, calm, confused cta no w/r/r rrr s1s2 no gallop Soft benign +bs No edema Grossly intact     Data Reviewed: I have personally reviewed following labs and imaging studies  CBC: Recent Labs  Lab 07/10/20 1055  WBC 7.8  NEUTROABS 6.0  HGB 7.8*  HCT 25.5*  MCV 72.2*  PLT 941   Basic Metabolic Panel: Recent Labs  Lab 07/10/20 1055 07/11/20 0307  NA 133* 134*  K 3.8 3.8  CL 101 98  CO2 26 27  GLUCOSE 112* 131*  BUN 17 11  CREATININE 0.58 0.47  CALCIUM 8.4* 8.6*   GFR: Estimated Creatinine Clearance: 34.6 mL/min (by C-G formula based on SCr of 0.47 mg/dL). Liver Function Tests: Recent Labs  Lab 07/10/20 1055 07/11/20 0307  AST 12* 13*  ALT 8 8  ALKPHOS 53 55  BILITOT 0.9 1.0  PROT 5.8* 5.2*  ALBUMIN 2.9* 2.5*   Recent Labs  Lab 07/10/20 1055  LIPASE 27   No results for input(s): AMMONIA in the last 168 hours. Coagulation Profile: No results for input(s): INR, PROTIME in the last 168 hours. Cardiac Enzymes: No results for input(s): CKTOTAL, CKMB, CKMBINDEX, TROPONINI in the last 168 hours. BNP (last 3 results) No results for input(s): PROBNP in the last 8760 hours. HbA1C:  No results for input(s): HGBA1C in the last 72 hours. CBG: No results for input(s): GLUCAP in the last 168 hours. Lipid Profile: No results for input(s): CHOL, HDL, LDLCALC, TRIG, CHOLHDL, LDLDIRECT in the last 72 hours. Thyroid Function Tests: No results for input(s): TSH, T4TOTAL, FREET4, T3FREE, THYROIDAB in the last 72 hours. Anemia Panel: Recent Labs    07/10/20 1055  FERRITIN 21  TIBC 322  IRON 12*   Sepsis Labs: No results for input(s): PROCALCITON, LATICACIDVEN in the last 168 hours.  Recent Results (from the past 240 hour(s))  Resp Panel by RT-PCR (Flu A&B, Covid) Nasopharyngeal Swab     Status: None   Collection Time: 07/10/20 12:49 PM   Specimen: Nasopharyngeal Swab; Nasopharyngeal(NP) swabs in vial transport medium  Result Value Ref Range  Status   SARS Coronavirus 2 by RT PCR NEGATIVE NEGATIVE Final    Comment: (NOTE) SARS-CoV-2 target nucleic acids are NOT DETECTED.  The SARS-CoV-2 RNA is generally detectable in upper respiratory specimens during the acute phase of infection. The lowest concentration of SARS-CoV-2 viral copies this assay can detect is 138 copies/mL. A negative result does not preclude SARS-Cov-2 infection and should not be used as the sole basis for treatment or other patient management decisions. A negative result may occur with  improper specimen collection/handling, submission of specimen other than nasopharyngeal swab, presence of viral mutation(s) within the areas targeted by this assay, and inadequate number of viral copies(<138 copies/mL). A negative result must be combined with clinical observations, patient history, and epidemiological information. The expected result is Negative.  Fact Sheet for Patients:  EntrepreneurPulse.com.au  Fact Sheet for Healthcare Providers:  IncredibleEmployment.be  This test is no t yet approved or cleared by the [place] FDA and  has been authorized for detection and/or diagnosis of SARS-CoV-2 by FDA under an Emergency Use Authorization (EUA). This EUA will remain  in effect (meaning this test can be used) for the duration of the COVID-19 declaration under Section 564(b)(1) of the Act, 21 U.S.C.section 360bbb-3(b)(1), unless the authorization is terminated  or revoked sooner.       Influenza A by PCR NEGATIVE NEGATIVE Final   Influenza B by PCR NEGATIVE NEGATIVE Final    Comment: (NOTE) The Xpert Xpress SARS-CoV-2/FLU/RSV plus assay is intended as an aid in the diagnosis of influenza from Nasopharyngeal swab specimens and should not be used as a sole basis for treatment. Nasal washings and aspirates are unacceptable for Xpert Xpress SARS-CoV-2/FLU/RSV testing.  Fact Sheet for  Patients: EntrepreneurPulse.com.au  Fact Sheet for Healthcare Providers: IncredibleEmployment.be  This test is not yet approved or cleared by the [place] FDA and has been authorized for detection and/or diagnosis of SARS-CoV-2 by FDA under an Emergency Use Authorization (EUA). This EUA will remain in effect (meaning this test can be used) for the duration of the COVID-19 declaration under Section 564(b)(1) of the Act, 21 U.S.C. section 360bbb-3(b)(1), unless the authorization is terminated or revoked.  Performed at [facility], [address] [place], [place] [address]          Radiology Studies: DG Chest 2 View  Result Date: 07/10/2020 CLINICAL DATA:  Fall, low back pain and weakness. EXAM: CHEST - 2 VIEW COMPARISON:  Chest x-ray dated 10/08/2015. FINDINGS: Heart size and mediastinal contours are stable. Chronic bronchitic changes centrally. No confluent opacity to suggest a developing pneumonia. No pleural effusion or pneumothorax is seen. No acute appearing osseous abnormality. Degenerative spondylosis of the slightly kyphotic thoracic spine. IMPRESSION:  No acute findings. No evidence of pneumonia or pulmonary edema. Electronically Signed   By: Franki Cabot M.D.   On: 07/10/2020 12:50   DG Lumbar Spine 2-3 Views  Result Date: 07/10/2020 CLINICAL DATA:  [place] on Monday. LOWER back pain and weakness since fall. EXAM: LUMBAR SPINE - 2-3 VIEW COMPARISON:  02/08/2017 FINDINGS: There is normal alignment of the lumbar spine. Patient has had prior vertebral augmentation at T12 and L4. There are chronic superior endplate fractures of L3 and L5. There is question of anterior cortical discontinuity involving the L1 and L2 vertebral bodies. Detail limited given presence of significant overlying bowel gas. Nonobstructive bowel gas pattern. There is atherosclerotic calcification of the abdominal aorta. Remote LEFT hip ORIF IMPRESSION: Possible  compression fractures of L1 and L2. Consider further evaluation with CT of the lumbar spine. Remote vertebral augmentation at T12 and L4. Electronically Signed   By: Nolon Nations M.D.   On: 07/10/2020 12:46   DG Abdomen 1 View  Result Date: 07/10/2020 CLINICAL DATA:  Abdominal pain, fall on Monday, low back pain and weakness since. Abdominal distension. EXAM: ABDOMEN - 1 VIEW COMPARISON:  Plain film of the abdomen dated 11/22/2009. FINDINGS: Moderately distended gas-filled loops of small bowel within the central/LEFT abdomen. Air is seen within the colon. No evidence of free intraperitoneal air. Degenerative spondylosis of the scoliotic thoracolumbar spine. Diffuse osteopenia limits characterization of osseous detail, but no evidence of acute osseous abnormality is appreciated. Changes of vertebroplasty within the lower thoracic and lumbar spine. IMPRESSION: Moderately distended gas-filled loops of small bowel within the central/LEFT abdomen. Findings could represent a partial small bowel obstruction or ileus. Electronically Signed   By: Franki Cabot M.D.   On: 07/10/2020 12:52   CT Abdomen Pelvis W Contrast  Result Date: 07/10/2020 CLINICAL DATA:  Fall on Monday, low back pain since. Abdominal distension. Bowel obstruction suspected. EXAM: CT ABDOMEN AND PELVIS WITH CONTRAST TECHNIQUE: Multidetector CT imaging of the abdomen and pelvis was performed using the standard protocol following bolus administration of intravenous contrast. CONTRAST:  111mL OMNIPAQUE IOHEXOL 300 MG/ML  SOLN COMPARISON:  CT abdomen dated 01/09/2010. FINDINGS: Lower chest: Bibasilar atelectasis. Hepatobiliary: No focal liver abnormality is seen. Gallbladder is unremarkable. No bile duct dilatation. Pancreas: Unremarkable. No pancreatic ductal dilatation or surrounding inflammatory changes. Spleen: Splenic cysts. Adrenals/Urinary Tract: Adrenal glands are unremarkable. LEFT renal cysts, largest measuring approximately 6 cm  greatest dimension. Small nonobstructing renal stones bilaterally. No suspicious mass or hydronephrosis. No perinephric fluid. No evidence of renal injury. No ureteral or bladder calculi are identified. Bladder appears normal, partially decompressed. Stomach/Bowel: Diverticulosis of the sigmoid colon but no focal inflammatory change to suggest acute diverticulitis. Moderate amount of stool and gas throughout the normal-caliber colon. No dilated small bowel loops are seen on this exam. No evidence of bowel wall inflammation. Irregular mass-like thickening within the cecum, measuring 4.6 cm greatest dimension and approximately 2 cm thickness, suspicious for neoplastic growth. This masslike thickening is adjacent to the terminal ileum. Appendix is not convincingly seen but there are no focal inflammatory changes about the cecum to suggest acute appendicitis. Vascular/Lymphatic: Atherosclerosis of the ectatic abdominal aorta. The no acute appearing vascular abnormality. No enlarged lymph nodes are seen within the abdomen or pelvis. Reproductive: Chronic LEFT adnexal cyst. Presumed hysterectomy. No free fluid or inflammatory change within either adnexal region. Other: No free fluid or abscess collection. No free intraperitoneal air. Musculoskeletal: No acute or suspicious osseous abnormality. No evidence of acute osseous fracture or  dislocation. IMPRESSION: 1. Irregular mass-like thickening within the cecum, measuring 4.6 cm greatest dimension and approximately 2 cm thickness, located within the cecum adjacent to the terminal ileum, highly suspicious for malignancy. Consider further characterization with nonemergent colonoscopy. 2. No evidence of bowel obstruction. Moderate amount of stool and gas throughout the mildly distended colon suggesting constipation or mild colonic ileus. No small bowel dilatation. 3. Colonic diverticulosis without evidence of acute diverticulitis. 4. Bilateral nephrolithiasis. No ureteral or  bladder calculi. No hydronephrosis. Aortic Atherosclerosis (ICD10-I70.0). Electronically Signed   By: Franki Cabot M.D.   On: 07/10/2020 13:03   CT L-SPINE NO CHARGE  Result Date: 07/10/2020 CLINICAL DATA:  Compression fracture, L spine. EXAM: CT LUMBAR SPINE WITHOUT CONTRAST TECHNIQUE: Multidetector CT imaging of the lumbar spine was performed without intravenous contrast administration. Multiplanar CT image reconstructions were also generated. COMPARISON:  CT abdomen/pelvis 07/10/2020. Lumbar spine radiographs 07/10/2020. FINDINGS: Segmentation: 5 lumbar vertebrae. The caudal most well-formed intervertebral disc space is designated L5-S1 Alignment: Lumbar levocurvature.  Mild T12-L1 grade 1 retrolisthesis Vertebrae: Redemonstrated chronic T12, L3, L4 and L5 vertebral compression fracture deformities. Sequela of prior vertebral augmentation at T12 and L4. No L1 or L2 compression fracture is identified (as was questioned on the lumbar spine radiographs performed earlier today). No evidence of acute fracture to the lumbar spine. Paraspinal and other soft tissues: Please refer to same day CT abdomen/pelvis for description of abdominopelvic soft tissue findings. Paraspinal soft tissues within normal limits. Disc levels: Moderate disc space narrowing within the visualized lower thoracic spine and at T12-L1. Moderate disc space narrowing at L5-S1. Otherwise, there is no more than mild disc space narrowing within the lumbar spine. T12-L1: Mild bony retropulsion at the level of the T12 inferior endplate. Associated disc bulge. Mild relative spinal canal narrowing. Mild bilateral bony neural foraminal narrowing. L1-L2: Disc bulge. No appreciable significant spinal canal stenosis. Mild bilateral neural foraminal narrowing. L2-L3: Disc bulge. Facet arthrosis. Suspected moderate spinal canal stenosis. Bilateral neural foraminal narrowing (moderate right, mild left). L3-L4: Disc bulge. Facet arthrosis and ligamentum flavum  hypertrophy. Suspected at least moderate spinal canal stenosis. Moderate bilateral neural foraminal narrowing. L4-L5: Disc bulge. Moderate facet arthrosis with ligamentum flavum hypertrophy. Suspected severe spinal canal stenosis. Moderate bilateral neural foraminal narrowing. L5-S1: Disc bulge with endplate spurring and superimposed partially calcified small central disc protrusion. Facet arthrosis with ligamentum flavum hypertrophy. Suspected mild/moderate central canal stenosis with bilateral subarticular narrowing. Moderate left neural foraminal narrowing. IMPRESSION: No evidence of acute fracture to the lumbar spine. Chronic vertebral compression fractures at T12, L3, L4 and L5. Prior vertebral augmentation at T12 and L4. Lumbar spondylosis as detailed. Spinal canal stenosis is greatest at L4-L5, where there is suspected multifactorial severe spinal canal stenosis. Sites of mild and moderate neural foraminal narrowing, as detailed. Lumbar levocurvature. Electronically Signed   By: Kellie Simmering DO   On: 07/10/2020 15:37        Scheduled Meds: . beta carotene w/minerals  1 tablet Oral Daily  . cholecalciferol  1,000 Units Oral Daily  . enoxaparin (LOVENOX) injection  40 mg Subcutaneous Q24H  . hydrochlorothiazide  25 mg Oral Daily  . lidocaine  1 patch Transdermal Q24H  . magnesium gluconate  250 mg Oral Daily  . pantoprazole  40 mg Oral Daily  . polyethylene glycol  17 g Oral Daily  . pravastatin  40 mg Oral QHS  . Vitamin D (Ergocalciferol)  50,000 Units Oral Q Mon   Continuous Infusions:  Assessment & Plan:  Principal Problem:   Back pain, acute Active Problems:   Abdominal pain   Constipation   Colonic mass   Anemia of chronic disease   Fall   Compression fracture of L1 lumbar vertebra (HCC)   Compression fracture of L2 lumbar vertebra (HCC)   Essential hypertension   Iron deficiency anemia   Back pain/compression fractures of L1 and L2 Following a fall Lumbar spine  x-ray shows possible compression fractures involving L1 and L2 Neurosurgery recommends possible LSO if tolerates and PT    Mass of colon/constipation /anemia of chronic disease Patient noted to have a distended abdomen with a palpable mass in the right lumbar/lower quadrant CT scan of abdomen and pelvis shows  Irregular mass-like thickening within the cecum General surgery- no urgent surgical intervention GI consulted-drinking the prep for colonoscopy would be very difficult for him to be calm/ Recommend palliative care GI recommends daily MiraLAX and enema to help evacuate bowel contents Oncology consulted-chemo will be more detrimental than helpful 1 dose of IV Feraheme-order by oncology 4/19-spoke to daughter and interested in hospice house Palliative care /hospice was contacted to see pt.     Hypertension Continue HCTZ    Probable Dementia- daughter stated for past few months pt has been confused. No official diagnosis of dementia.  Patient is being evaluated for hospice house   DVT prophylaxis: Lovenox Code Status: DNR Family Communication: daughter updated  Status is: Inpatient  Remains inpatient appropriate because:Inpatient level of care appropriate due to severity of illness   Dispo: The patient is from: Home              Anticipated d/c is to: TBD-being evaluated by hospice house              Patient currently is not medically stable to d/c.   Difficult to place patient No          LOS: 2 days   Time spent: 35 minutes with more than 50% on [place], MD [facility] Pager [phone]  If 7PM-7AM, please contact night-coverage 07/12/2020, 8:56 AM

## 2020-07-12 NOTE — Progress Notes (Signed)
[facility]) Hospital Liaison RN note:  Received request from Dr. Kurtis Bushman for family interest in [place]. Chart reviewed and eligibility is pending. Visited patient at bedside. Spoke with daughter, Enid Derry over the phone to confirm interest and explain services. Enid Derry was not able to talk at the moment and requests call back in the am. Unfortunately, Hospice Home does not have a room to offer today. Hospital care team is aware. [facility] Liaison will continue to follow for room availability.  Please call with any hospice related questions or concerns.  Thank you for the opportunity to participate in this patient's care.  Zandra Abts, RN [facility] Liaison [phone]

## 2020-07-12 NOTE — Consult Note (Signed)
[place]  [phone] [phone]   Name: Shelia Stewart Date: 07/12/2020 MRN: [id number]  DOB: 05-05-1925  Patient Care Team: Tracie Harrier, MD as PCP - General (Internal Medicine)    REASON FOR CONSULTATION: Shelia Stewart is a 87 y.o. female with multiple medical problems including breast cancer status postmastectomy and hypertension, who was admitted to hospital 07/10/2020 with weakness following a fall at home.  Patient was noted to have abdominal distention and a palpable right abdominal mass.  Abdominal CT revealed a cecal mass measuring 4.6 cm highly suspicious for malignancy.  Patient was also found to have compression fracture of L1 and L2.  Palliative care was consulted to address goals.  SOCIAL HISTORY:     reports that she has never smoked. She does not have any smokeless tobacco history on file. She reports that she does not drink alcohol and does not use drugs.  Patient is widowed.  She has a daughter who is involved in her care.  ADVANCE DIRECTIVES:  None on file  CODE STATUS: DNR  PAST MEDICAL HISTORY: Past Medical History:  Diagnosis Date  . Breast cancer ([place])   . Hypertension     PAST SURGICAL HISTORY:  Past Surgical History:  Procedure Laterality Date  . ABDOMINAL HYSTERECTOMY    . BACK SURGERY    . BREAST SURGERY    . KYPHOPLASTY N/A 02/08/2017   Procedure: [id number];  Surgeon: Hessie Knows, MD;  Location: [facility] ORS;  Service: Orthopedics;  Laterality: N/A;  . MASTECTOMY      HEMATOLOGY/ONCOLOGY HISTORY:  Oncology History   No history exists.    ALLERGIES:  is allergic to codeine and alendronate.  MEDICATIONS:  Current Facility-Administered Medications  Medication Dose Route Frequency Provider Last Rate Last Admin  . acetaminophen (TYLENOL) tablet 650 mg  650 mg Oral Q6H PRN Agbata, Tochukwu, MD      . beta carotene w/minerals (OCUVITE) tablet 1 tablet   1 tablet Oral Daily Agbata, Tochukwu, MD   1 tablet at 07/12/20 0930  . cholecalciferol (VITAMIN D3) tablet 1,000 Units  1,000 Units Oral Daily Agbata, Tochukwu, MD   1,000 Units at 07/12/20 0929  . enoxaparin (LOVENOX) injection 40 mg  40 mg Subcutaneous Q24H Agbata, Tochukwu, MD   40 mg at 07/11/20 2132  . hydrochlorothiazide (HYDRODIURIL) tablet 25 mg  25 mg Oral Daily Agbata, Tochukwu, MD   25 mg at 07/12/20 0929  . HYDROcodone-acetaminophen (NORCO/VICODIN) 5-325 MG per tablet 1 tablet  1 tablet Oral Q6H PRN Agbata, Tochukwu, MD   1 tablet at 07/11/20 1653  . lidocaine (LIDODERM) 5 % 1 patch  1 patch Transdermal Q24H Nolberto Hanlon, MD   1 patch at 07/12/20 0931  . magnesium gluconate (MAGONATE) tablet 250 mg  250 mg Oral Daily Agbata, Tochukwu, MD   250 mg at 07/12/20 0929  . ondansetron (ZOFRAN) tablet 4 mg  4 mg Oral Q6H PRN Agbata, Tochukwu, MD       Or  . ondansetron (ZOFRAN) injection 4 mg  4 mg Intravenous Q6H PRN Agbata, Tochukwu, MD      . pantoprazole (PROTONIX) EC tablet 40 mg  40 mg Oral Daily Agbata, Tochukwu, MD   40 mg at 07/12/20 0929  . polyethylene glycol (MIRALAX / GLYCOLAX) packet 17 g  17 g Oral Daily Agbata, Tochukwu, MD   17 g at 07/12/20 0930  . polyvinyl alcohol (LIQUIFILM TEARS) 1.4 % ophthalmic solution 1-2 drop  1-2 drop  Both Eyes TID PRN Agbata, Tochukwu, MD      . pravastatin (PRAVACHOL) tablet 40 mg  40 mg Oral QHS Agbata, Tochukwu, MD   40 mg at 07/11/20 2129  . sodium phosphate (FLEET) 7-19 GM/118ML enema 1 enema  1 enema Rectal Daily PRN Mansy, Arvella Merles, MD      . Vitamin D (Ergocalciferol) (DRISDOL) capsule 50,000 Units  50,000 Units Oral Q Donneta Romberg, Tochukwu, MD   50,000 Units at 07/11/20 1211    VITAL SIGNS: BP (!) 157/98 (BP Location: Left Arm)   Pulse (!) 110   Temp 98.3 F (36.8 C) (Oral)   Resp 17   Ht 5\' 2"  (1.575 m)   Wt 110 lb (49.9 kg)   SpO2 97%   BMI 20.12 kg/m  Filed Weights   07/10/20 1048  Weight: 110 lb (49.9 kg)    Estimated body  mass index is 20.12 kg/m as calculated from the following:   Height as of this encounter: 5\' 2"  (1.575 m).   Weight as of this encounter: 110 lb (49.9 kg).  LABS: CBC:    Component Value Date/Time   WBC 7.8 07/10/2020 1055   HGB 7.8 (L) 07/10/2020 1055   HGB 12.1 11/01/2012 0335   HCT 25.5 (L) 07/10/2020 1055   HCT 35.3 11/01/2012 0335   PLT 348 07/10/2020 1055   PLT 205 11/01/2012 0335   MCV 72.2 (L) 07/10/2020 1055   MCV 85 11/01/2012 0335   NEUTROABS 6.0 07/10/2020 1055   NEUTROABS 6.5 11/01/2012 0335   LYMPHSABS 0.9 07/10/2020 1055   LYMPHSABS 1.6 11/01/2012 0335   MONOABS 0.6 07/10/2020 1055   MONOABS 0.9 11/01/2012 0335   EOSABS 0.2 07/10/2020 1055   EOSABS 0.2 11/01/2012 0335   BASOSABS 0.0 07/10/2020 1055   BASOSABS 0.0 11/01/2012 0335   Comprehensive Metabolic Panel:    Component Value Date/Time   NA 134 (L) 07/11/2020 0307   NA 139 11/01/2012 0335   K 3.8 07/11/2020 0307   K 3.4 (L) 11/01/2012 0335   CL 98 07/11/2020 0307   CL 106 11/01/2012 0335   CO2 27 07/11/2020 0307   CO2 29 11/01/2012 0335   BUN 11 07/11/2020 0307   BUN 25 (H) 11/01/2012 0335   CREATININE 0.47 07/11/2020 0307   CREATININE 0.93 11/01/2012 0335   GLUCOSE 131 (H) 07/11/2020 0307   GLUCOSE 90 11/01/2012 0335   CALCIUM 8.6 (L) 07/11/2020 0307   CALCIUM 9.0 11/01/2012 0335   AST 13 (L) 07/11/2020 0307   AST 18 10/30/2012 0944   ALT 8 07/11/2020 0307   ALT 16 10/30/2012 0944   ALKPHOS 55 07/11/2020 0307   ALKPHOS 103 10/30/2012 0944   BILITOT 1.0 07/11/2020 0307   BILITOT 0.9 10/30/2012 0944   PROT 5.2 (L) 07/11/2020 0307   PROT 7.4 10/30/2012 0944   ALBUMIN 2.5 (L) 07/11/2020 0307   ALBUMIN 3.5 10/30/2012 0944    RADIOGRAPHIC STUDIES: DG Chest 2 View  Result Date: 07/10/2020 CLINICAL DATA:  Fall, low back pain and weakness. EXAM: CHEST - 2 VIEW COMPARISON:  Chest x-ray dated 10/08/2015. FINDINGS: Heart size and mediastinal contours are stable. Chronic bronchitic changes  centrally. No confluent opacity to suggest a developing pneumonia. No pleural effusion or pneumothorax is seen. No acute appearing osseous abnormality. Degenerative spondylosis of the slightly kyphotic thoracic spine. IMPRESSION: No acute findings. No evidence of pneumonia or pulmonary edema. Electronically Signed   By: Franki Cabot M.D.   On: 07/10/2020 12:50   DG  Lumbar Spine 2-3 Views  Result Date: 07/10/2020 CLINICAL DATA:  [place] on Monday. LOWER back pain and weakness since fall. EXAM: LUMBAR SPINE - 2-3 VIEW COMPARISON:  02/08/2017 FINDINGS: There is normal alignment of the lumbar spine. Patient has had prior vertebral augmentation at T12 and L4. There are chronic superior endplate fractures of L3 and L5. There is question of anterior cortical discontinuity involving the L1 and L2 vertebral bodies. Detail limited given presence of significant overlying bowel gas. Nonobstructive bowel gas pattern. There is atherosclerotic calcification of the abdominal aorta. Remote LEFT hip ORIF IMPRESSION: Possible compression fractures of L1 and L2. Consider further evaluation with CT of the lumbar spine. Remote vertebral augmentation at T12 and L4. Electronically Signed   By: Nolon Nations M.D.   On: 07/10/2020 12:46   DG Abdomen 1 View  Result Date: 07/10/2020 CLINICAL DATA:  Abdominal pain, fall on Monday, low back pain and weakness since. Abdominal distension. EXAM: ABDOMEN - 1 VIEW COMPARISON:  Plain film of the abdomen dated 11/22/2009. FINDINGS: Moderately distended gas-filled loops of small bowel within the central/LEFT abdomen. Air is seen within the colon. No evidence of free intraperitoneal air. Degenerative spondylosis of the scoliotic thoracolumbar spine. Diffuse osteopenia limits characterization of osseous detail, but no evidence of acute osseous abnormality is appreciated. Changes of vertebroplasty within the lower thoracic and lumbar spine. IMPRESSION: Moderately distended gas-filled loops of  small bowel within the central/LEFT abdomen. Findings could represent a partial small bowel obstruction or ileus. Electronically Signed   By: Franki Cabot M.D.   On: 07/10/2020 12:52   CT Abdomen Pelvis W Contrast  Result Date: 07/10/2020 CLINICAL DATA:  Fall on Monday, low back pain since. Abdominal distension. Bowel obstruction suspected. EXAM: CT ABDOMEN AND PELVIS WITH CONTRAST TECHNIQUE: Multidetector CT imaging of the abdomen and pelvis was performed using the standard protocol following bolus administration of intravenous contrast. CONTRAST:  151mL OMNIPAQUE IOHEXOL 300 MG/ML  SOLN COMPARISON:  CT abdomen dated 01/09/2010. FINDINGS: Lower chest: Bibasilar atelectasis. Hepatobiliary: No focal liver abnormality is seen. Gallbladder is unremarkable. No bile duct dilatation. Pancreas: Unremarkable. No pancreatic ductal dilatation or surrounding inflammatory changes. Spleen: Splenic cysts. Adrenals/Urinary Tract: Adrenal glands are unremarkable. LEFT renal cysts, largest measuring approximately 6 cm greatest dimension. Small nonobstructing renal stones bilaterally. No suspicious mass or hydronephrosis. No perinephric fluid. No evidence of renal injury. No ureteral or bladder calculi are identified. Bladder appears normal, partially decompressed. Stomach/Bowel: Diverticulosis of the sigmoid colon but no focal inflammatory change to suggest acute diverticulitis. Moderate amount of stool and gas throughout the normal-caliber colon. No dilated small bowel loops are seen on this exam. No evidence of bowel wall inflammation. Irregular mass-like thickening within the cecum, measuring 4.6 cm greatest dimension and approximately 2 cm thickness, suspicious for neoplastic growth. This masslike thickening is adjacent to the terminal ileum. Appendix is not convincingly seen but there are no focal inflammatory changes about the cecum to suggest acute appendicitis. Vascular/Lymphatic: Atherosclerosis of the ectatic  abdominal aorta. The no acute appearing vascular abnormality. No enlarged lymph nodes are seen within the abdomen or pelvis. Reproductive: Chronic LEFT adnexal cyst. Presumed hysterectomy. No free fluid or inflammatory change within either adnexal region. Other: No free fluid or abscess collection. No free intraperitoneal air. Musculoskeletal: No acute or suspicious osseous abnormality. No evidence of acute osseous fracture or dislocation. IMPRESSION: 1. Irregular mass-like thickening within the cecum, measuring 4.6 cm greatest dimension and approximately 2 cm thickness, located within the cecum adjacent to the terminal  ileum, highly suspicious for malignancy. Consider further characterization with nonemergent colonoscopy. 2. No evidence of bowel obstruction. Moderate amount of stool and gas throughout the mildly distended colon suggesting constipation or mild colonic ileus. No small bowel dilatation. 3. Colonic diverticulosis without evidence of acute diverticulitis. 4. Bilateral nephrolithiasis. No ureteral or bladder calculi. No hydronephrosis. Aortic Atherosclerosis (ICD10-I70.0). Electronically Signed   By: Franki Cabot M.D.   On: 07/10/2020 13:03   CT L-SPINE NO CHARGE  Result Date: 07/10/2020 CLINICAL DATA:  Compression fracture, L spine. EXAM: CT LUMBAR SPINE WITHOUT CONTRAST TECHNIQUE: Multidetector CT imaging of the lumbar spine was performed without intravenous contrast administration. Multiplanar CT image reconstructions were also generated. COMPARISON:  CT abdomen/pelvis 07/10/2020. Lumbar spine radiographs 07/10/2020. FINDINGS: Segmentation: 5 lumbar vertebrae. The caudal most well-formed intervertebral disc space is designated L5-S1 Alignment: Lumbar levocurvature.  Mild T12-L1 grade 1 retrolisthesis Vertebrae: Redemonstrated chronic T12, L3, L4 and L5 vertebral compression fracture deformities. Sequela of prior vertebral augmentation at T12 and L4. No L1 or L2 compression fracture is identified  (as was questioned on the lumbar spine radiographs performed earlier today). No evidence of acute fracture to the lumbar spine. Paraspinal and other soft tissues: Please refer to same day CT abdomen/pelvis for description of abdominopelvic soft tissue findings. Paraspinal soft tissues within normal limits. Disc levels: Moderate disc space narrowing within the visualized lower thoracic spine and at T12-L1. Moderate disc space narrowing at L5-S1. Otherwise, there is no more than mild disc space narrowing within the lumbar spine. T12-L1: Mild bony retropulsion at the level of the T12 inferior endplate. Associated disc bulge. Mild relative spinal canal narrowing. Mild bilateral bony neural foraminal narrowing. L1-L2: Disc bulge. No appreciable significant spinal canal stenosis. Mild bilateral neural foraminal narrowing. L2-L3: Disc bulge. Facet arthrosis. Suspected moderate spinal canal stenosis. Bilateral neural foraminal narrowing (moderate right, mild left). L3-L4: Disc bulge. Facet arthrosis and ligamentum flavum hypertrophy. Suspected at least moderate spinal canal stenosis. Moderate bilateral neural foraminal narrowing. L4-L5: Disc bulge. Moderate facet arthrosis with ligamentum flavum hypertrophy. Suspected severe spinal canal stenosis. Moderate bilateral neural foraminal narrowing. L5-S1: Disc bulge with endplate spurring and superimposed partially calcified small central disc protrusion. Facet arthrosis with ligamentum flavum hypertrophy. Suspected mild/moderate central canal stenosis with bilateral subarticular narrowing. Moderate left neural foraminal narrowing. IMPRESSION: No evidence of acute fracture to the lumbar spine. Chronic vertebral compression fractures at T12, L3, L4 and L5. Prior vertebral augmentation at T12 and L4. Lumbar spondylosis as detailed. Spinal canal stenosis is greatest at L4-L5, where there is suspected multifactorial severe spinal canal stenosis. Sites of mild and moderate neural  foraminal narrowing, as detailed. Lumbar levocurvature. Electronically Signed   By: Kellie Simmering DO   On: 07/10/2020 15:37    PERFORMANCE STATUS (ECOG) : 4 - Bedbound  Review of Systems Unable to complete  Physical Exam General: Frail-appearing Pulmonary: Unlabored Extremities: no edema, no joint deformities Skin: no rashes Neurological: Weakness, confusion  IMPRESSION: Patient is awake but stares at the ceiling and did not follow commands nor which she engage me meaningfully in a conversation regarding goals.  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  Patient has been seen in consultation by medical oncology, GI, and surgeon.  Patient has not felt to be a candidate for surgical intervention or systemic chemotherapy.  She would not tolerate GI prep for colonoscopy.  I called and briefly spoke with her daughter by phone.  Her daughter spoke earlier with Dr. Kurtis Bushman.  Daughter verbalized feeling that comfort care was the  best approach at this time.  She was okay with foregoing work-up and is interested in transferring patient to the [facility] for end of life care.   Hospice liaison has been consulted.  PLAN: -Agree with transferring patient to the Hospice Home for comfort/end of life care   Time Total: 30 minutes  Visit consisted of counseling and education dealing with the complex and emotionally intense issues of symptom management and palliative care in the setting of serious and potentially life-threatening illness.Greater than 50%  of this time was spent counseling and coordinating care related to the above assessment and plan.  Signed by: Altha Harm, PhD, NP-C

## 2020-07-12 NOTE — Progress Notes (Signed)
07/12/20  Reviewed consult note from Shelia Stewart.  Patient will be transferring to hospice care / comfort care.  Surgery team will sign off, but please feel free to contact us if there are any questions or concerns, or if there's anything we can assist with.  Olean Ree, MD

## 2020-07-12 NOTE — TOC Initial Note (Signed)
Transition of Care [facility]) - Initial/Assessment Note    Patient Details  Name: Shelia Stewart MRN: [id number] Date of Birth: 01/14/1926  [facility]) CM/SW Contact:    Beverly Sessions, RN Phone Number: 07/12/2020, 3:05 PM  Clinical Narrative:                  Dr Kurtis Bushman made referral to [facility] with [facility] for residential hospice home [place] [facility] with Palliative was in agreement  Per Kieth Brightly with [facility] no bed available today     Please consult TOC if indicated      Patient Goals and CMS Choice        Expected Discharge Plan and Services                                                Prior Living Arrangements/Services                       Activities of Daily Living      Permission Sought/Granted                  Emotional Assessment              Admission diagnosis:  Fracture [T14.8XXA] Generalized abdominal pain [R10.84] Generalized weakness [R53.1] Colonic mass [K63.89] Abdominal pain [R10.9] Patient Active Problem List   Diagnosis Date Noted  . Palliative care encounter   . Iron deficiency anemia   . Abdominal pain 07/10/2020  . Constipation 07/10/2020  . Colonic mass 07/10/2020  . Anemia of chronic disease 07/10/2020  . Fall 07/10/2020  . Compression fracture of L1 lumbar vertebra ([place]) 07/10/2020  . Compression fracture of L2 lumbar vertebra ([place]) 07/10/2020  . Back pain, acute 07/10/2020  . Essential hypertension 07/10/2020   PCP:  Tracie Harrier, MD Pharmacy:   [facility] [address] [place] [address] [place] [address] Phone: [phone] Fax: [phone]     Social Determinants of Health (SDOH) Interventions    Readmission Risk Interventions No flowsheet data found.

## 2020-07-13 MED ORDER — LORAZEPAM 0.5 MG PO TABS: 0.5000 mg | ORAL_TABLET | Freq: Three times a day (TID) | ORAL | 0 refills | Status: AC | PRN

## 2020-07-13 NOTE — Discharge Summary (Signed)
[place] [facility] [place] NAME: Shelia Stewart    MR#:  [id number]  DATE OF BIRTH:  July 11, 1925  DATE OF ADMISSION:  07/10/2020   ADMITTING PHYSICIAN: Collier Bullock, MD  DATE OF DISCHARGE: No discharge date for patient encounter.  PRIMARY CARE PHYSICIAN: Tracie Harrier, MD   ADMISSION DIAGNOSIS:  Fracture [T14.8XXA] Generalized abdominal pain [R10.84] Generalized weakness [R53.1] Colonic mass [K63.89] Abdominal pain [R10.9] DISCHARGE DIAGNOSIS:  Principal Problem:   Back pain, acute Active Problems:   Abdominal pain   Constipation   Colonic mass   Anemia of chronic disease   Fall   Compression fracture of L1 lumbar vertebra (HCC)   Compression fracture of L2 lumbar vertebra (HCC)   Essential hypertension   Iron deficiency anemia   Palliative care encounter  SECONDARY DIAGNOSIS:   Past Medical History:  Diagnosis Date  . Breast cancer ([place])   . Hypertension    HOSPITAL COURSE:  87 y.o. female with multiple medical problems including breast cancer status postmastectomy and hypertension, who was admitted with weakness following a fall at home.  Patient was noted to have abdominal distention and a palpable right abdominal mass.  Abdominal CT revealed a cecal mass measuring 4.6 cm highly suspicious for malignancy.  Patient was also found to have compression fracture of L1 and L2.   Abdominal pain due to constipation cecal mass measuring 4.6 cm highly suspicious for malignancy   Anemia of chronic disease   Fall   Compression fracture of L1 lumbar vertebra (HCC)   Compression fracture of L2 lumbar vertebra (HCC)   Essential hypertension   Iron deficiency anemia dementia   Daughter interested in hospice home   DISCHARGE CONDITIONS:  fair CONSULTS OBTAINED:  Treatment Team:  Lloyd Huger, MD Deetta Perla, MD DRUG ALLERGIES:   Allergies  Allergen Reactions  . Codeine Anaphylaxis  . Alendronate Other (See Comments)    GI upset.    DISCHARGE MEDICATIONS:   Allergies as of 07/13/2020      Reactions   Codeine Anaphylaxis   Alendronate Other (See Comments)   GI upset.      Medication List    STOP taking these medications   acetaminophen 325 MG tablet Commonly known as: TYLENOL   aspirin EC 81 MG tablet   beta carotene w/minerals tablet   cholecalciferol 1000 units tablet Commonly known as: VITAMIN D   hydrochlorothiazide 25 MG tablet Commonly known as: HYDRODIURIL   HYDROcodone-acetaminophen 5-325 MG tablet Commonly known as: Norco   Magnesium 250 MG Tabs   omeprazole 20 MG capsule Commonly known as: PRILOSEC   polyethylene glycol powder 17 GM/SCOOP powder Commonly known as: GLYCOLAX/MIRALAX   polyvinyl alcohol 1.4 % ophthalmic solution Commonly known as: LIQUIFILM TEARS   pravastatin 40 MG tablet Commonly known as: PRAVACHOL   senna-docusate 8.6-50 MG tablet Commonly known as: Senokot-S   traMADol 50 MG tablet Commonly known as: ULTRAM   Vitamin D (Ergocalciferol) 1.25 MG (50000 UNIT) Caps capsule Commonly known as: DRISDOL     TAKE these medications   LORazepam 0.5 MG tablet Commonly known as: Ativan Take 1 tablet (0.5 mg total) by mouth every 8 (eight) hours as needed for anxiety.      DISCHARGE INSTRUCTIONS:   DIET:  Pleasure feeds DISCHARGE CONDITION:  Critical ACTIVITY:  Activity as tolerated OXYGEN:  Home Oxygen: No.  Oxygen Delivery: room air DISCHARGE LOCATION:  Hospice Home   If you experience worsening of your admission symptoms, develop shortness of breath,  life threatening emergency, suicidal or homicidal thoughts you must seek medical attention immediately by calling 911 or calling your MD immediately  if symptoms less severe.  You Must read complete instructions/literature along with all the possible adverse reactions/side effects for all the Medicines you take and that have been prescribed to you. Take any new Medicines after you have completely  understood and accpet all the possible adverse reactions/side effects.   Please note  You were cared for by a hospitalist during your hospital stay. If you have any questions about your discharge medications or the care you received while you were in the hospital after you are discharged, you can call the unit and asked to speak with the hospitalist on call if the hospitalist that took care of you is not available. Once you are discharged, your primary care physician will handle any further medical issues. Please note that NO REFILLS for any discharge medications will be authorized once you are discharged, as it is imperative that you return to your primary care physician (or establish a relationship with a primary care physician if you do not have one) for your aftercare needs so that they can reassess your need for medications and monitor your lab values.    On the day of Discharge:  VITAL SIGNS:  Blood pressure 133/73, pulse 87, temperature 97.7 F (36.5 C), temperature source Axillary, resp. rate 17, height 5\' 2"  (1.575 m), weight 49.9 kg, SpO2 96 %. PHYSICAL EXAMINATION:  GENERAL:  87 y.o.-year-old patient lying in the bed with no acute distress. Frail-appearing Pulmonary: Unlabored Extremities: no edema, no joint deformities Skin: no rashes Neurological: Weakness, confusion DATA REVIEW:   CBC Recent Labs  Lab 07/10/20 1055  WBC 7.8  HGB 7.8*  HCT 25.5*  PLT 348    Chemistries  Recent Labs  Lab 07/11/20 0307  NA 134*  K 3.8  CL 98  CO2 27  GLUCOSE 131*  BUN 11  CREATININE 0.47  CALCIUM 8.6*  AST 13*  ALT 8  ALKPHOS 55  BILITOT 1.0     Outpatient follow-up   30 Day Unplanned Readmission Risk Score   Flowsheet Row ED to Hosp-Admission (Current) from 07/10/2020 in [facility]  30 Day Unplanned Readmission Risk Score (%) 14.07 Filed at 07/13/2020 0801     This score is the patient's risk of an unplanned readmission within 30  days of being discharged (0 -100%). The score is based on dignosis, age, lab data, medications, orders, and past utilization.   Low:  0-14.9   Medium: 15-21.9   High: 22-29.9   Extreme: 30 and above         Management plans discussed with the patient, nursing and they are in agreement.  CODE STATUS: DNR   TOTAL TIME TAKING CARE OF THIS PATIENT: 45 minutes.    Max Sane M.D on 07/13/2020 at 10:24 AM  [facility]   CC: Primary care physician; Tracie Harrier, MD   Note: This dictation was prepared with Dragon dictation along with smaller phrase technology. Any transcriptional errors that result from this process are unintentional.

## 2020-07-13 NOTE — Progress Notes (Addendum)
[facility]) Hospital Liaison RN note:  [facility] does have a room to offer today. Spoke with daughter, Enid Derry over the phone and she will sign paperwork at the [facility] today. Transport has been arranged for [facility] care team is aware. I will fax the discharge summary to the Hospice [facility] once it becomes available.  Please call with any hospice related questions or concerns.  Thank you for the opportunity to participate in this patient's care.  Zandra Abts, RN [facility] Hospital Liaison [phone]

## 2020-07-13 NOTE — TOC Transition Note (Addendum)
Transition of Care [facility]) - CM/SW Discharge Note   Patient Details  Name: Shelia Stewart MRN: [id number] Date of Birth: 1925-12-13  Transition of [facility]) CM/SW Contact:  Beverly Sessions, RN Phone Number: 07/13/2020, 10:55 AM   Clinical Narrative:     Kieth Brightly with [facility] has coordinated with daughter for discharge to Hospice home today EMS transport arranged with [facility] for 5 pm per [employer] requested.  EMS packet and signed DNR on chart  Bedside RN to call report   Final next level of care: [place] Barriers to Discharge: No Barriers Identified   Patient Goals and CMS Choice        Discharge Placement                Patient to be transferred to facility by: EMS      Discharge Plan and Services                                     Social Determinants of Health (SDOH) Interventions     Readmission Risk Interventions No flowsheet data found.

## 2020-07-13 NOTE — Care Management Important Message (Signed)
Important Message  Patient Details  Name: Shelia Stewart MRN: [id number] Date of Birth: [birth year]   Medicare Important Message Given:  Other (see comment)  On comfort care measures with plan to transfer to [place] once bed available.  Medicare IM withheld at this time out of respect for patient and family.    Dannette Barbara 07/13/2020, 9:16 AM

## 2020-08-24 DEATH — deceased
